# Patient Record
Sex: Female | Born: 1975 | Race: Black or African American | Hispanic: No | Smoking: Former smoker
Health system: Southern US, Community
[De-identification: ages and names within clinical notes are randomized; demographics above are authoritative.]

## PROBLEM LIST (undated history)

## (undated) DIAGNOSIS — F419 Anxiety disorder, unspecified: Secondary | ICD-10-CM

## (undated) DIAGNOSIS — E669 Obesity, unspecified: Secondary | ICD-10-CM

## (undated) DIAGNOSIS — E559 Vitamin D deficiency, unspecified: Secondary | ICD-10-CM

## (undated) DIAGNOSIS — G5600 Carpal tunnel syndrome, unspecified upper limb: Secondary | ICD-10-CM

## (undated) DIAGNOSIS — G4730 Sleep apnea, unspecified: Secondary | ICD-10-CM

## (undated) DIAGNOSIS — D649 Anemia, unspecified: Secondary | ICD-10-CM

## (undated) DIAGNOSIS — F32A Depression, unspecified: Secondary | ICD-10-CM

## (undated) DIAGNOSIS — M549 Dorsalgia, unspecified: Secondary | ICD-10-CM

## (undated) DIAGNOSIS — R7303 Prediabetes: Secondary | ICD-10-CM

## (undated) DIAGNOSIS — N840 Polyp of corpus uteri: Secondary | ICD-10-CM

## (undated) DIAGNOSIS — Z789 Other specified health status: Secondary | ICD-10-CM

## (undated) DIAGNOSIS — R600 Localized edema: Secondary | ICD-10-CM

## (undated) DIAGNOSIS — M1990 Unspecified osteoarthritis, unspecified site: Secondary | ICD-10-CM

## (undated) DIAGNOSIS — B009 Herpesviral infection, unspecified: Secondary | ICD-10-CM

## (undated) HISTORY — DX: Prediabetes: R73.03

## (undated) HISTORY — DX: Localized edema: R60.0

## (undated) HISTORY — DX: Vitamin D deficiency, unspecified: E55.9

## (undated) HISTORY — DX: Anemia, unspecified: D64.9

## (undated) HISTORY — DX: Dorsalgia, unspecified: M54.9

## (undated) HISTORY — PX: SHOULDER SURGERY: SHX246

## (undated) HISTORY — DX: Sleep apnea, unspecified: G47.30

## (undated) HISTORY — DX: Anxiety disorder, unspecified: F41.9

## (undated) HISTORY — DX: Carpal tunnel syndrome, unspecified upper limb: G56.00

## (undated) HISTORY — DX: Unspecified osteoarthritis, unspecified site: M19.90

## (undated) HISTORY — DX: Obesity, unspecified: E66.9

## (undated) HISTORY — PX: HYSTEROSCOPY: SHX211

## (undated) HISTORY — DX: Depression, unspecified: F32.A

## (undated) HISTORY — PX: DILATION AND CURETTAGE OF UTERUS: SHX78

---

## 2000-07-12 ENCOUNTER — Emergency Department (HOSPITAL_COMMUNITY): Admission: EM | Admit: 2000-07-12 | Discharge: 2000-07-13 | Payer: Self-pay | Admitting: Emergency Medicine

## 2000-07-19 ENCOUNTER — Encounter: Admission: RE | Admit: 2000-07-19 | Discharge: 2000-07-19 | Payer: Self-pay | Admitting: Hematology and Oncology

## 2003-02-27 ENCOUNTER — Emergency Department (HOSPITAL_COMMUNITY): Admission: EM | Admit: 2003-02-27 | Discharge: 2003-02-27 | Payer: Self-pay | Admitting: Emergency Medicine

## 2003-02-27 ENCOUNTER — Encounter: Payer: Self-pay | Admitting: Emergency Medicine

## 2004-07-18 ENCOUNTER — Ambulatory Visit: Payer: Self-pay | Admitting: Family Medicine

## 2004-07-22 ENCOUNTER — Ambulatory Visit: Payer: Self-pay | Admitting: Internal Medicine

## 2004-08-26 ENCOUNTER — Ambulatory Visit: Payer: Self-pay | Admitting: Internal Medicine

## 2004-10-02 ENCOUNTER — Emergency Department (HOSPITAL_COMMUNITY): Admission: EM | Admit: 2004-10-02 | Discharge: 2004-10-02 | Payer: Self-pay | Admitting: Emergency Medicine

## 2004-10-02 IMAGING — CT CT HEAD W/O CM
1 series · 16 of 28 positions shown, 20 images · non-contrast
Comparison: None.

CLINICAL DATA: Headache.
TECHNIQUE: Contiguous axial CT images were taken from the skull base to the vertex without contrast administration.

[Series 2: head_seq 5.0 h30s · axial · 0.43mm/px · z∈[+1171,+1296]mm · 16 of 28 slices shown, 20 images]
[im 2/28  brain]
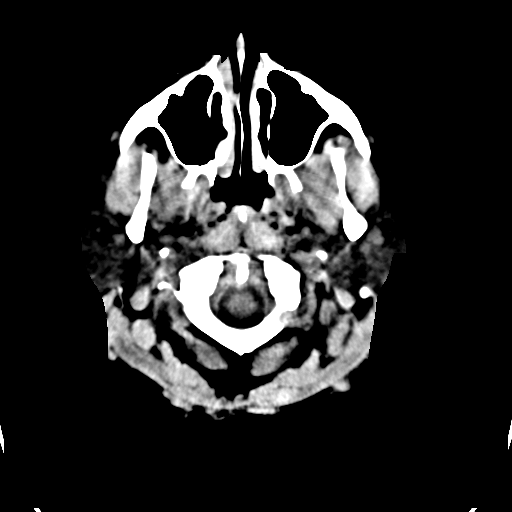
[im 2/28  bone]
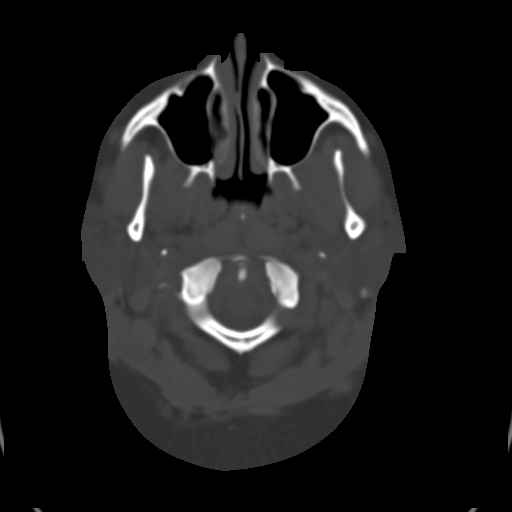
[im 4/28  brain]
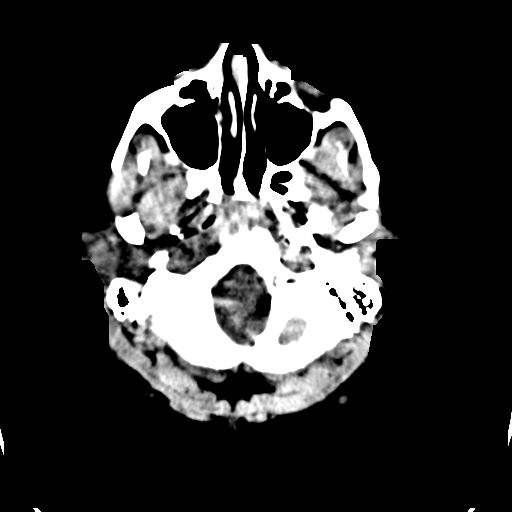
[im 6/28  brain]
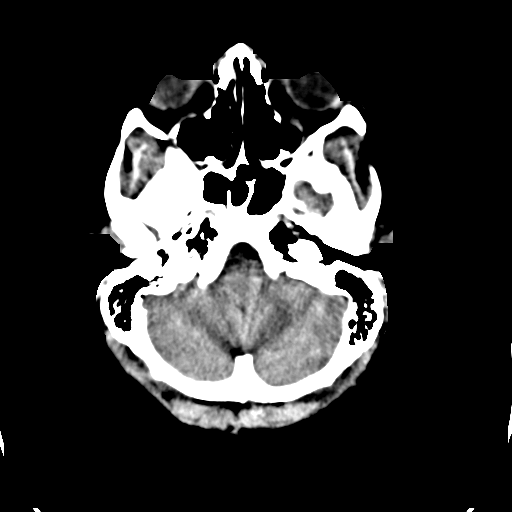
[im 7/28  brain]
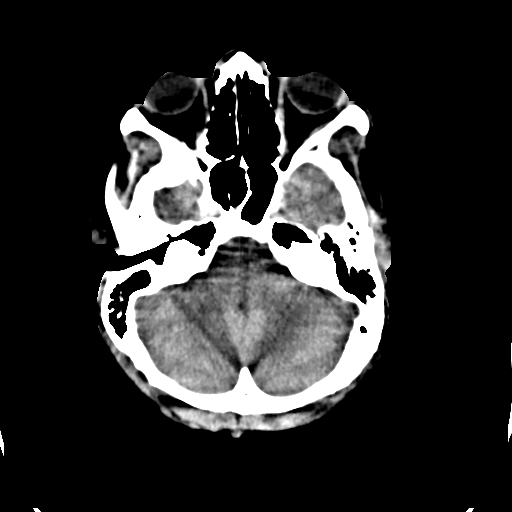
[im 9/28  brain]
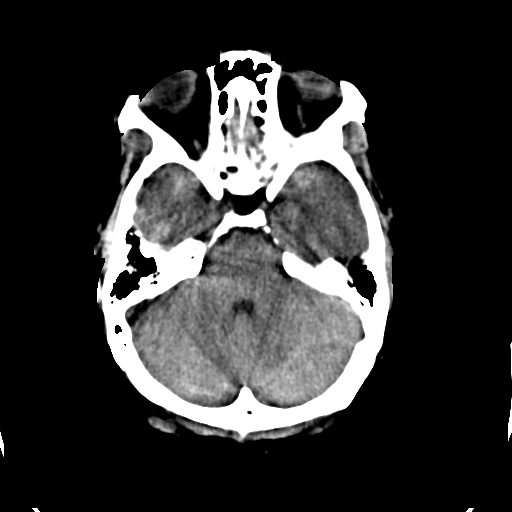
[im 9/28  bone]
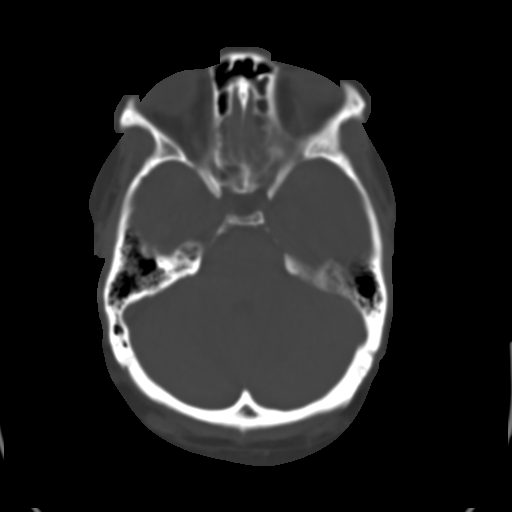
[im 10/28  brain]
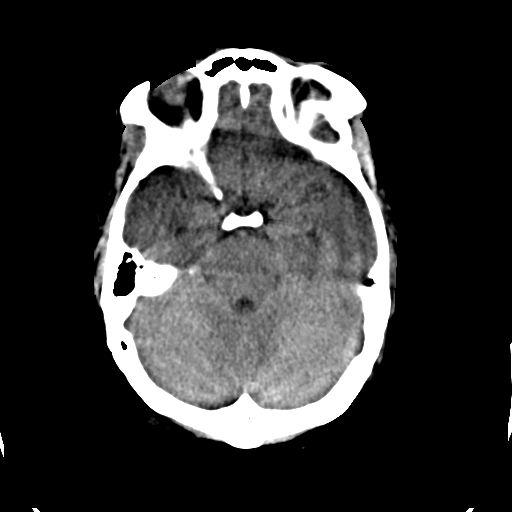
[im 12/28  brain]
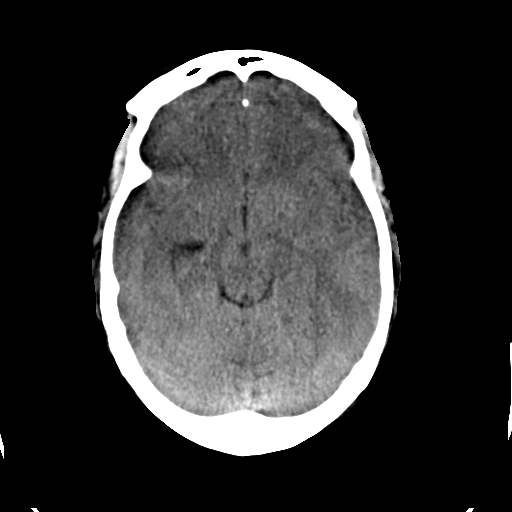
[im 14/28  brain]
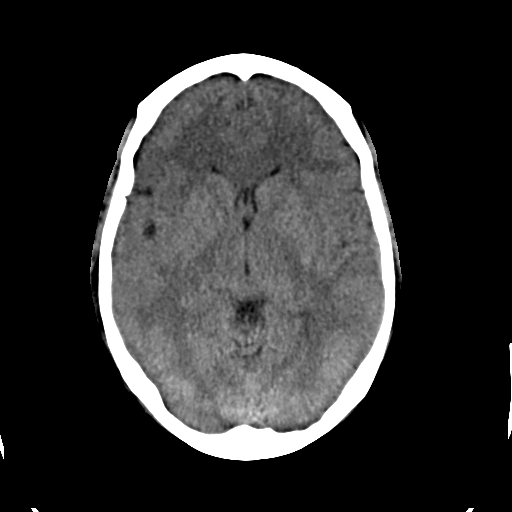
[im 15/28  brain]
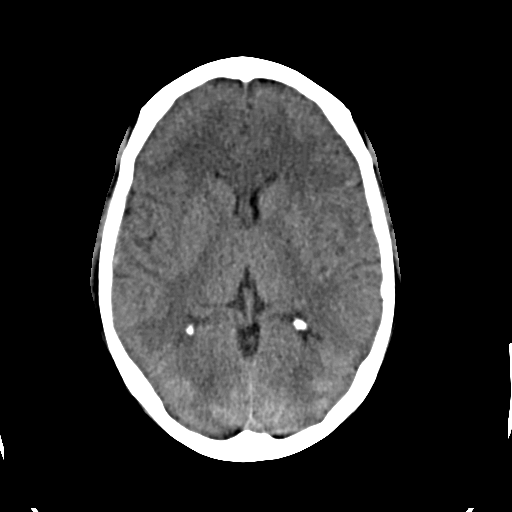
[im 15/28  bone]
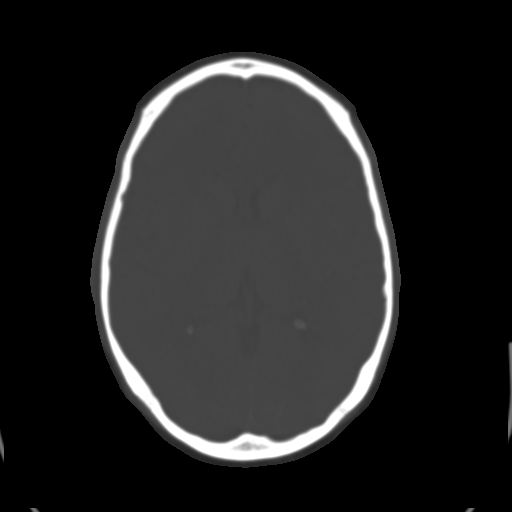
[im 17/28  brain]
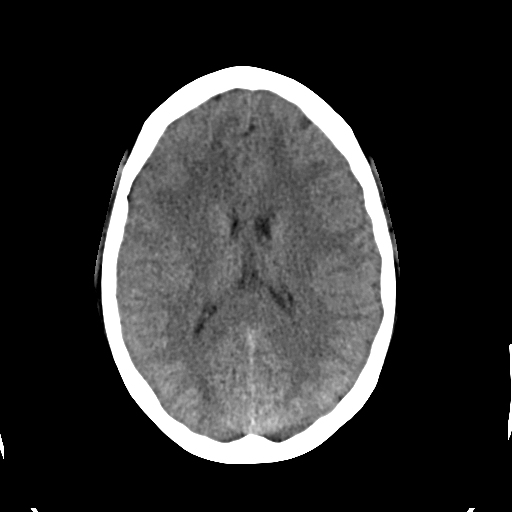
[im 19/28  brain]
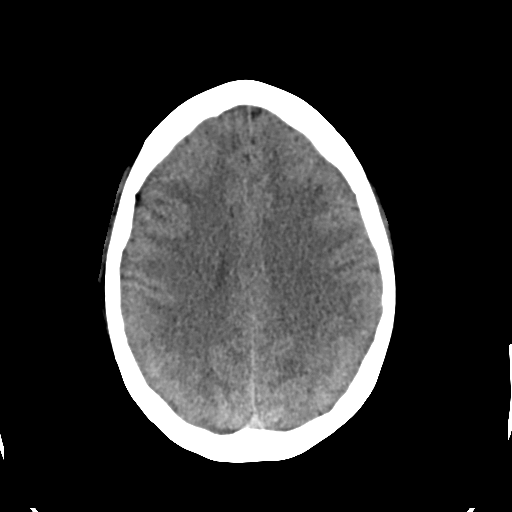
[im 20/28  brain]
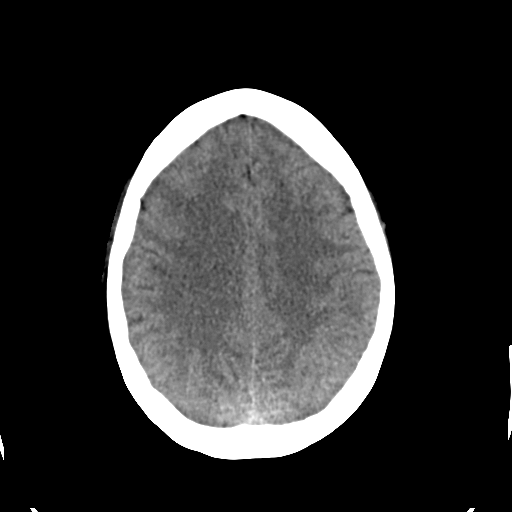
[im 22/28  brain]
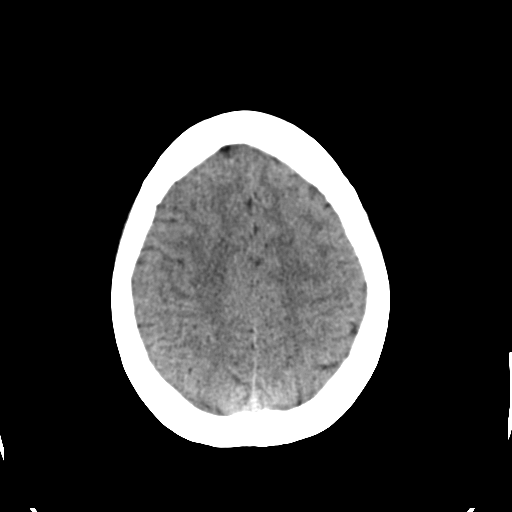
[im 22/28  bone]
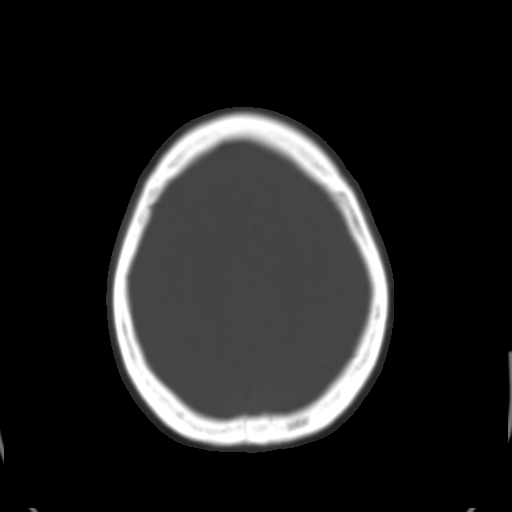
[im 23/28  brain]
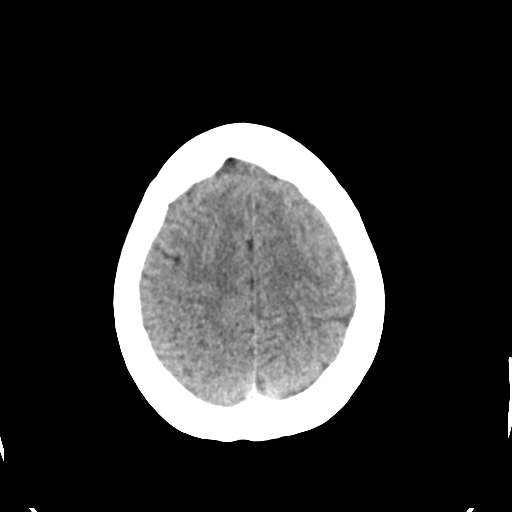
[im 25/28  brain]
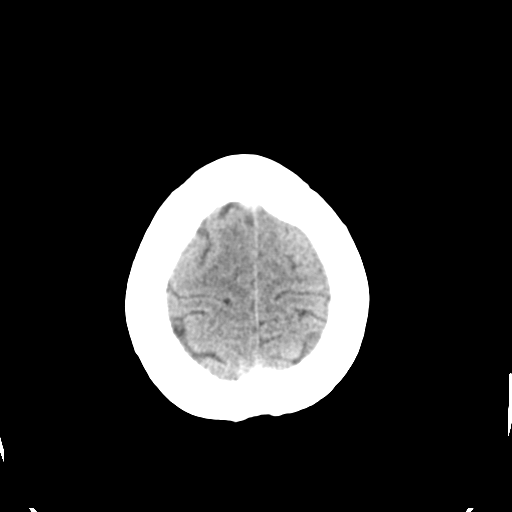
[im 27/28  brain]
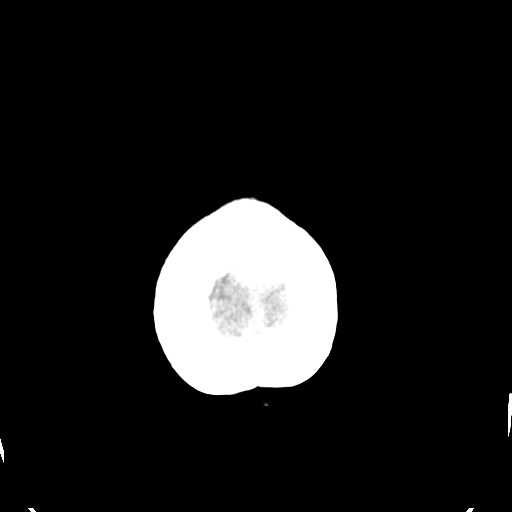

[16 of 28 positions shown; findings below may reference images not displayed]

FINDINGS: The brain appears normal without evidence of hemorrhage, infarct, mass, mass effect, midline shift or abnormal extraaxial fluid collection.  Ventricles are of appropriate size and configuration.  A tiny focus of mucosal thickening is identified in the right sphenoid sinus.
IMPRESSION: Negative exam.

## 2004-12-15 ENCOUNTER — Ambulatory Visit: Payer: Self-pay | Admitting: Family Medicine

## 2004-12-15 ENCOUNTER — Ambulatory Visit: Payer: Self-pay | Admitting: *Deleted

## 2007-06-08 LAB — CONVERTED CEMR LAB: Pap Smear: NORMAL

## 2007-08-18 ENCOUNTER — Encounter (INDEPENDENT_AMBULATORY_CARE_PROVIDER_SITE_OTHER): Payer: Self-pay | Admitting: Obstetrics and Gynecology

## 2007-08-18 ENCOUNTER — Ambulatory Visit (HOSPITAL_COMMUNITY): Admission: RE | Admit: 2007-08-18 | Discharge: 2007-08-18 | Payer: Self-pay | Admitting: Obstetrics and Gynecology

## 2007-09-02 ENCOUNTER — Ambulatory Visit: Payer: Self-pay | Admitting: Internal Medicine

## 2007-09-02 DIAGNOSIS — D649 Anemia, unspecified: Secondary | ICD-10-CM | POA: Insufficient documentation

## 2007-09-02 DIAGNOSIS — N920 Excessive and frequent menstruation with regular cycle: Secondary | ICD-10-CM | POA: Insufficient documentation

## 2007-09-02 DIAGNOSIS — E669 Obesity, unspecified: Secondary | ICD-10-CM | POA: Insufficient documentation

## 2007-09-02 DIAGNOSIS — B351 Tinea unguium: Secondary | ICD-10-CM | POA: Insufficient documentation

## 2007-09-02 LAB — CONVERTED CEMR LAB
Bilirubin Urine: NEGATIVE
Blood in Urine, dipstick: NEGATIVE
Glucose, Urine, Semiquant: NEGATIVE
Ketones, urine, test strip: NEGATIVE
Nitrite: NEGATIVE
Protein, U semiquant: NEGATIVE
Specific Gravity, Urine: 1.025
Urobilinogen, UA: 0.2
WBC Urine, dipstick: NEGATIVE
pH: 7

## 2007-09-06 LAB — CONVERTED CEMR LAB
ALT: 13 units/L (ref 0–35)
AST: 14 units/L (ref 0–37)
Albumin: 3.7 g/dL (ref 3.5–5.2)
Alkaline Phosphatase: 48 units/L (ref 39–117)
BUN: 10 mg/dL (ref 6–23)
Basophils Absolute: 0 10*3/uL (ref 0.0–0.1)
Basophils Relative: 0 % (ref 0–1)
Bilirubin, Direct: <0.1 mg/dL (ref 0.0–0.3)
CO2: 27 meq/L (ref 19–32)
Calcium: 9.4 mg/dL (ref 8.4–10.5)
Chloride: 103 meq/L (ref 96–112)
Cholesterol: 147 mg/dL (ref 0–200)
Creatinine, Ser: 0.84 mg/dL (ref 0.40–1.20)
Eosinophils Absolute: 0.1 10*3/uL (ref 0.0–0.7)
Eosinophils Relative: 1 % (ref 0–5)
Glucose, Bld: 123 mg/dL — ABNORMAL HIGH (ref 70–99)
HCT: 36.2 % (ref 36.0–46.0)
HDL: 58 mg/dL (ref >39)
Hemoglobin: 11.7 g/dL — ABNORMAL LOW (ref 12.0–15.0)
LDL Cholesterol: 69 mg/dL (ref 0–99)
Lymphocytes Relative: 39 % (ref 12–46)
Lymphs Abs: 2.8 10*3/uL (ref 0.7–4.0)
MCHC: 32.3 g/dL (ref 30.0–36.0)
MCV: 88.7 fL (ref 78.0–100.0)
Monocytes Absolute: 0.5 10*3/uL (ref 0.1–1.0)
Monocytes Relative: 7 % (ref 3–12)
Neutro Abs: 3.8 10*3/uL (ref 1.7–7.7)
Neutrophils Relative %: 52 % (ref 43–77)
Platelets: 489 10*3/uL — ABNORMAL HIGH (ref 150–400)
Potassium: 4.5 meq/L (ref 3.5–5.3)
RBC: 4.08 M/uL (ref 3.87–5.11)
RDW: 13.3 % (ref 11.5–15.5)
Sodium: 140 meq/L (ref 135–145)
TSH: 0.757 microintl units/mL (ref 0.350–5.50)
Total Bilirubin: 0.2 mg/dL — ABNORMAL LOW (ref 0.3–1.2)
Total CHOL/HDL Ratio: 2.5
Total Protein: 7 g/dL (ref 6.0–8.3)
Triglycerides: 98 mg/dL (ref <150)
VLDL: 20 mg/dL (ref 0–40)
WBC: 7.2 10*3/uL (ref 4.0–10.5)

## 2007-09-19 ENCOUNTER — Telehealth: Payer: Self-pay | Admitting: Family Medicine

## 2008-06-18 ENCOUNTER — Ambulatory Visit: Payer: Self-pay | Admitting: Internal Medicine

## 2008-06-18 DIAGNOSIS — J069 Acute upper respiratory infection, unspecified: Secondary | ICD-10-CM | POA: Insufficient documentation

## 2008-06-18 IMAGING — CR DG CHEST 2V
2 series · 2 of 2 positions shown · non-contrast
Comparison: None.

CLINICAL DATA: Chest pain, shortness of breath and cough.

CHEST - 2 VIEW

[view not recorded (1 of 2)]
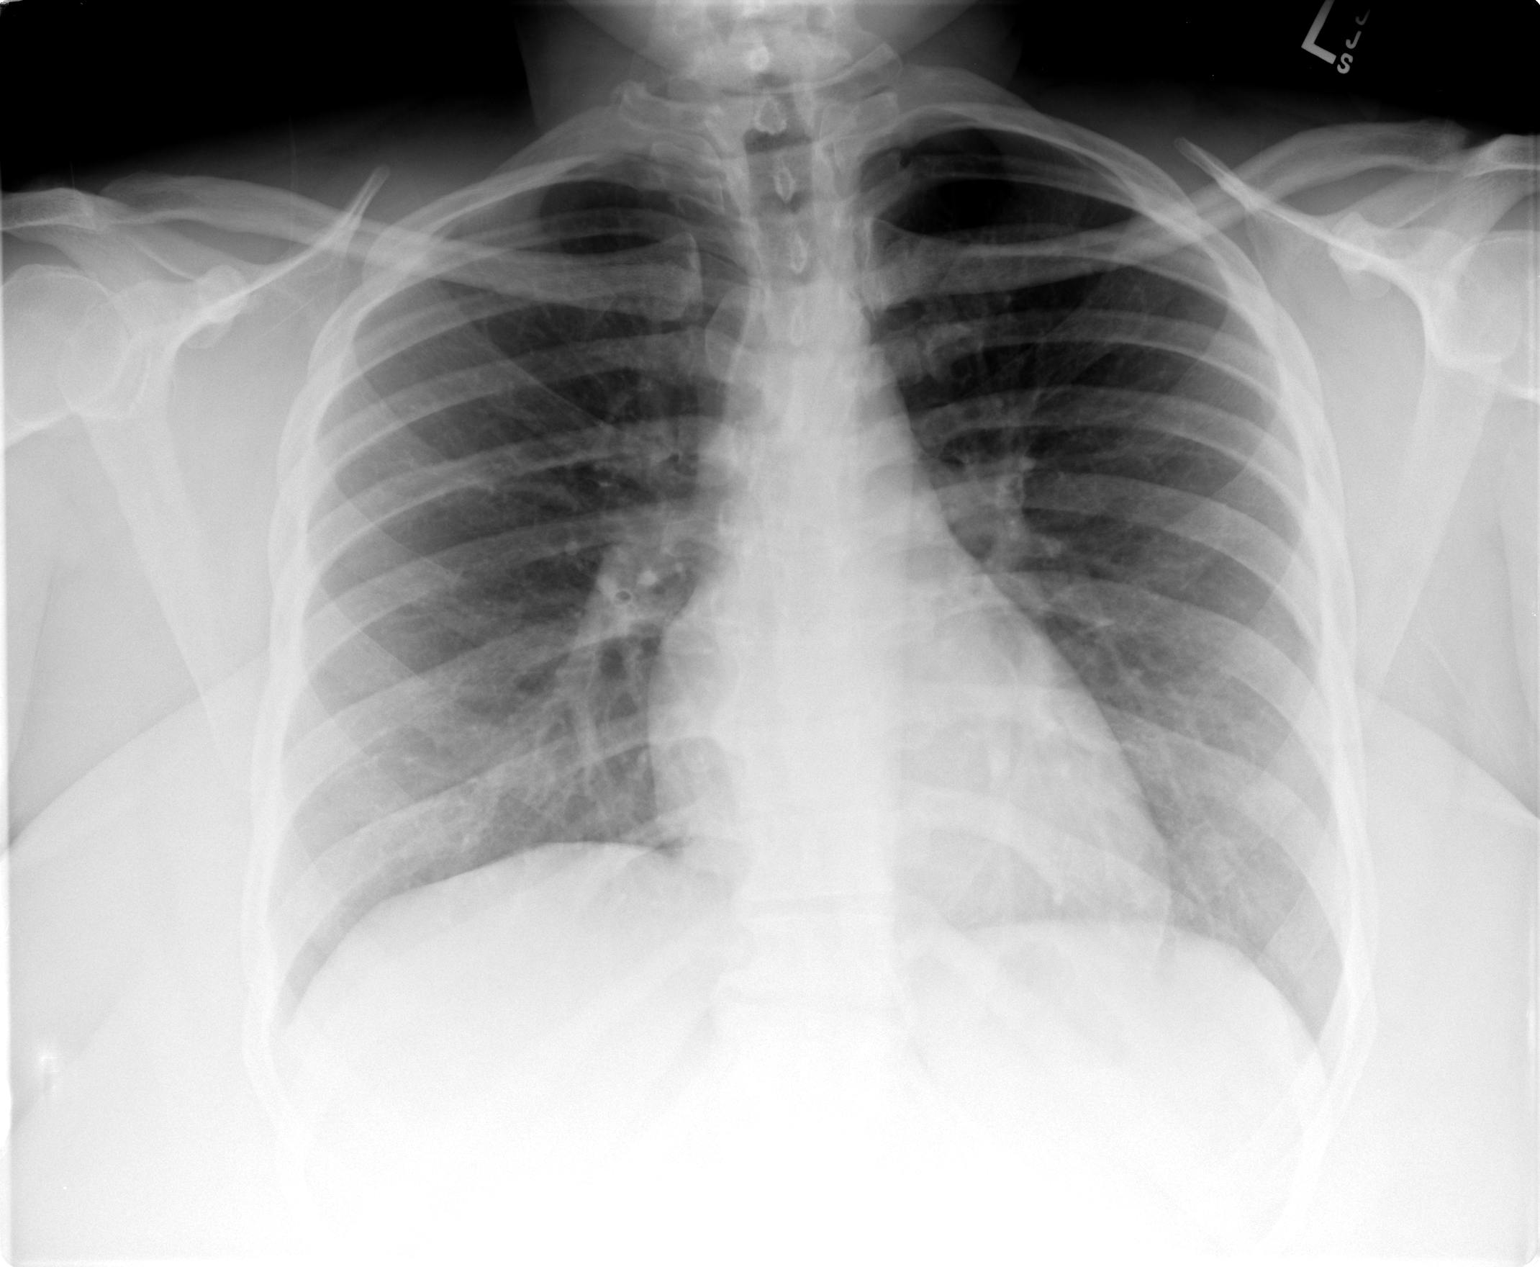

[view not recorded (2 of 2)]
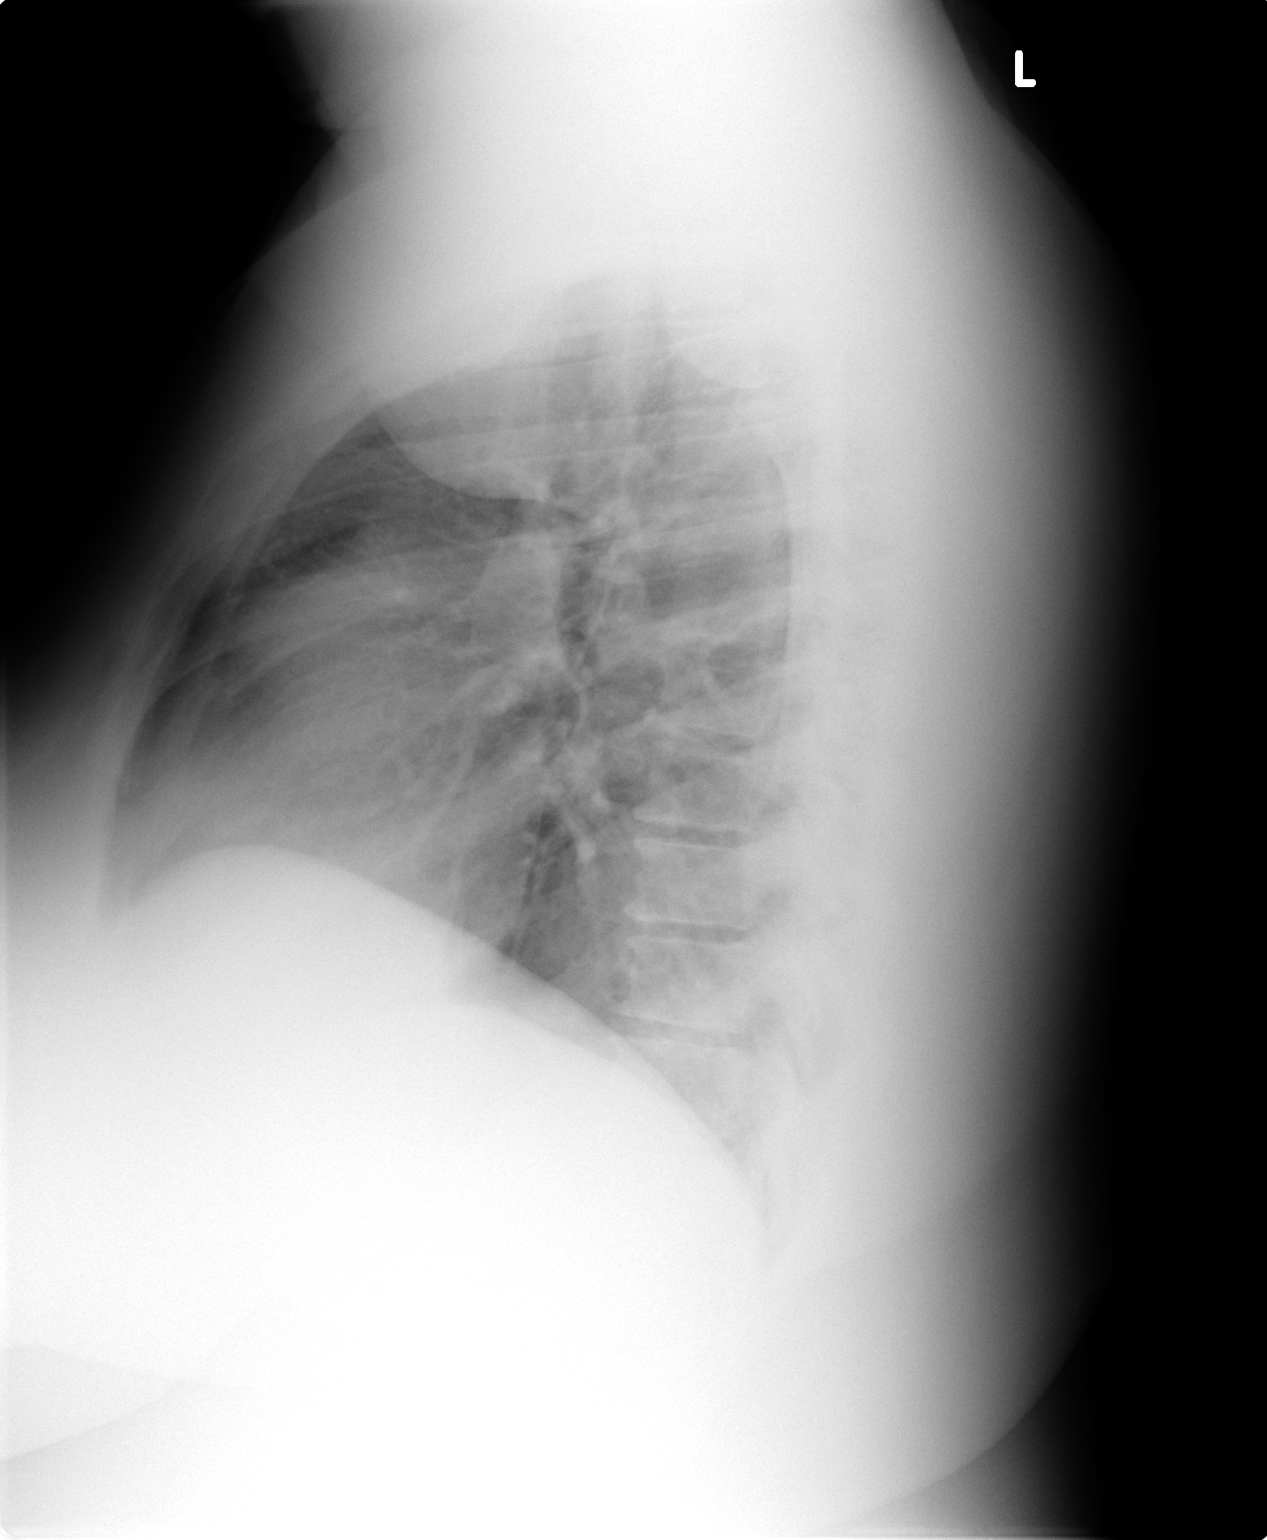

[2 of 2 positions shown; findings below may reference images not displayed]

FINDINGS: The cardiac silhouette, mediastinal and hilar contours
are within normal limits and stable. The lungs are clear.  No
pleural effusions. Minimal peribronchial thickening.

The bony thorax is intact.
IMPRESSION: 1. No acute cardiopulmonary findings.

## 2008-06-19 ENCOUNTER — Encounter: Payer: Self-pay | Admitting: Internal Medicine

## 2008-06-19 ENCOUNTER — Telehealth: Payer: Self-pay | Admitting: Internal Medicine

## 2008-07-20 ENCOUNTER — Telehealth: Payer: Self-pay | Admitting: Internal Medicine

## 2008-07-30 ENCOUNTER — Ambulatory Visit: Payer: Self-pay | Admitting: Internal Medicine

## 2008-07-30 DIAGNOSIS — M542 Cervicalgia: Secondary | ICD-10-CM | POA: Insufficient documentation

## 2008-07-30 DIAGNOSIS — R209 Unspecified disturbances of skin sensation: Secondary | ICD-10-CM | POA: Insufficient documentation
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
## 2008-11-12 ENCOUNTER — Encounter: Payer: Self-pay | Admitting: Internal Medicine

## 2008-11-13 ENCOUNTER — Telehealth: Payer: Self-pay | Admitting: Internal Medicine

## 2009-01-31 ENCOUNTER — Telehealth (INDEPENDENT_AMBULATORY_CARE_PROVIDER_SITE_OTHER): Payer: Self-pay | Admitting: *Deleted

## 2009-03-06 ENCOUNTER — Ambulatory Visit (HOSPITAL_COMMUNITY): Admission: RE | Admit: 2009-03-06 | Discharge: 2009-03-06 | Payer: Self-pay | Admitting: Obstetrics and Gynecology

## 2009-03-06 IMAGING — US US OB DETAIL+14 WK
1 series · 14 of 28 positions shown · non-contrast
Comparison: none

OBSTETRICAL ULTRASOUND:
 This ultrasound was performed in The [HOSPITAL], and the [facility] OB/GY[facility] report will be stored to [REDACTED] PACS.

[Series 1: us ob detail+14 wk · 14 of 57 slices shown]
[im 3/57]
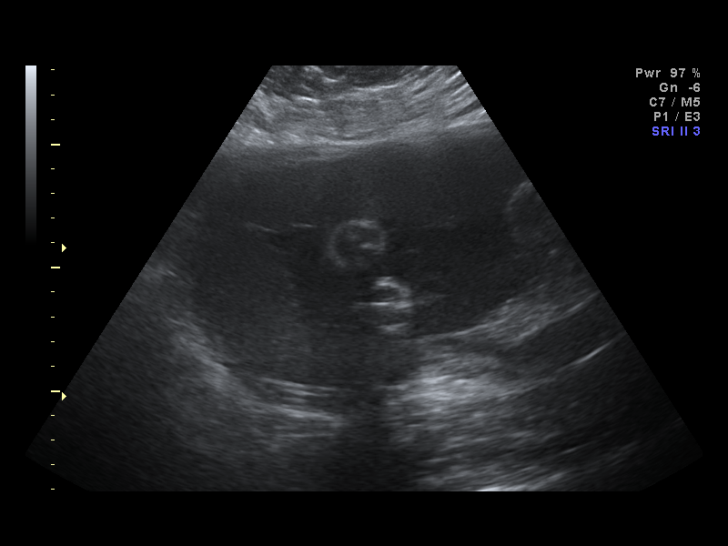
[im 7/57]
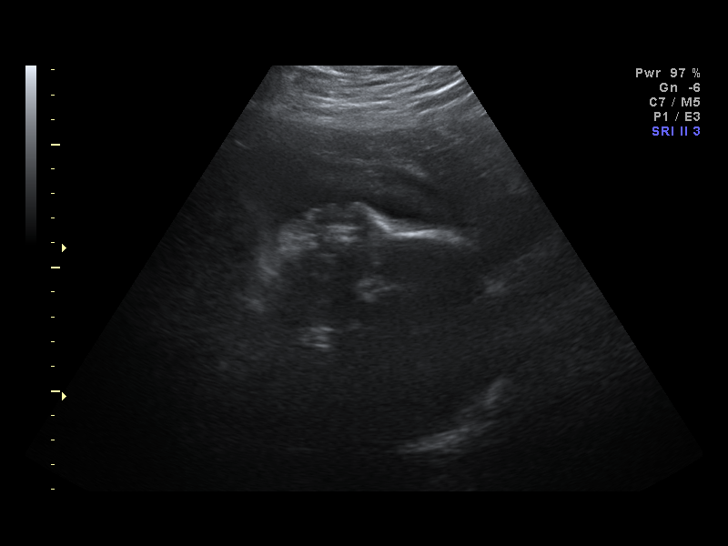
[im 11/57]
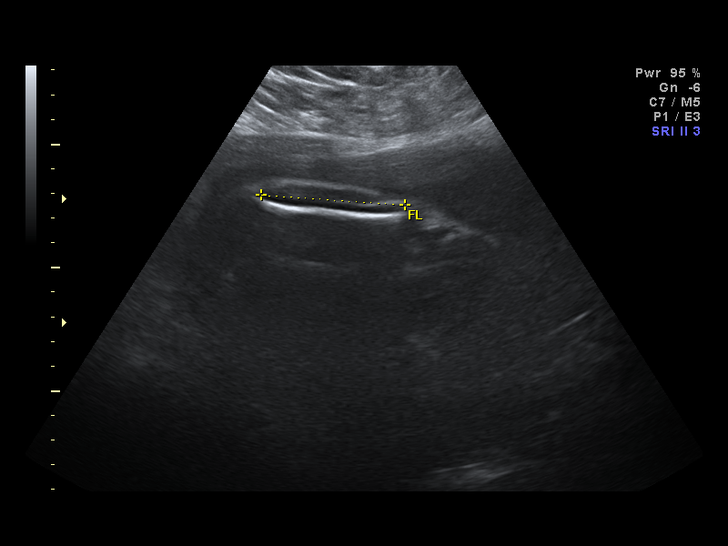
[im 15/57]
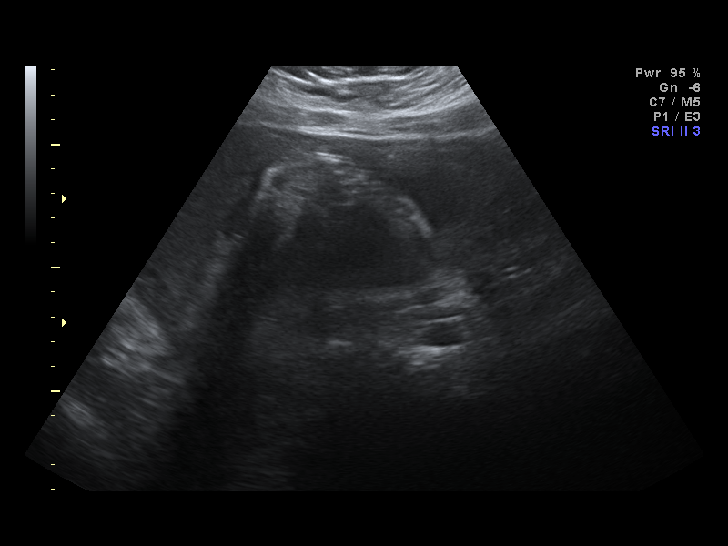
[im 19/57]
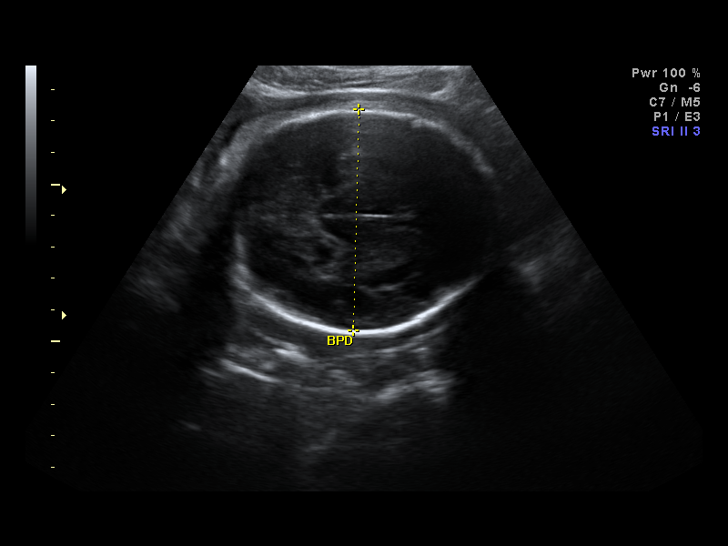
[im 23/57]
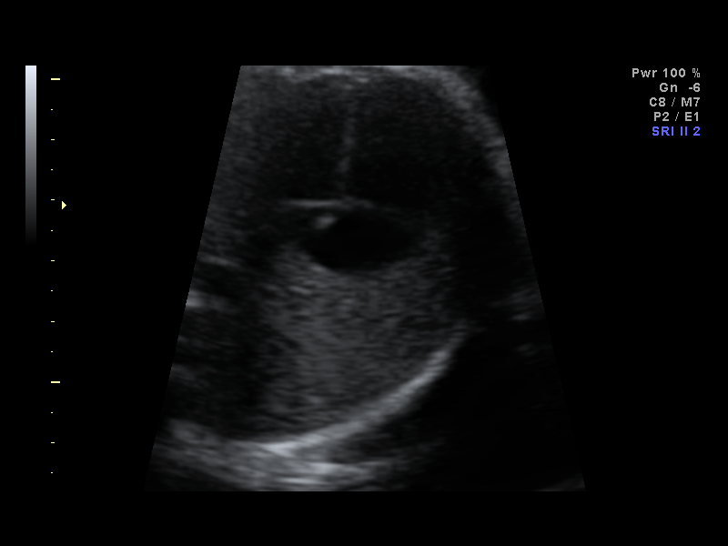
[im 27/57]
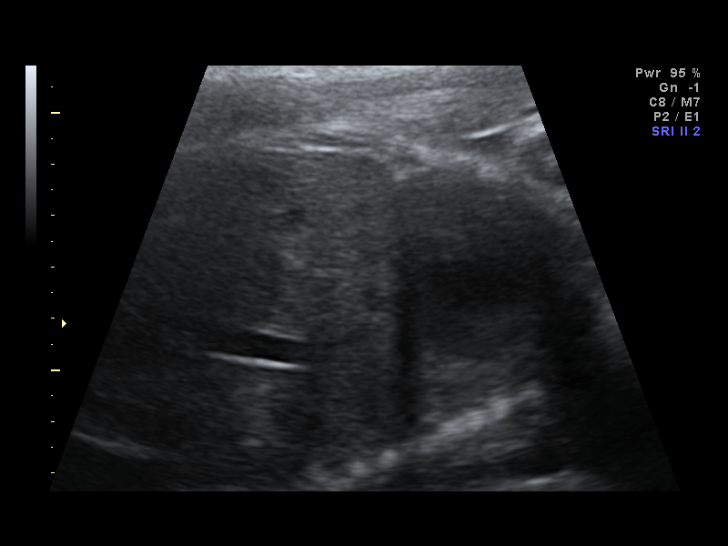
[im 32/57]
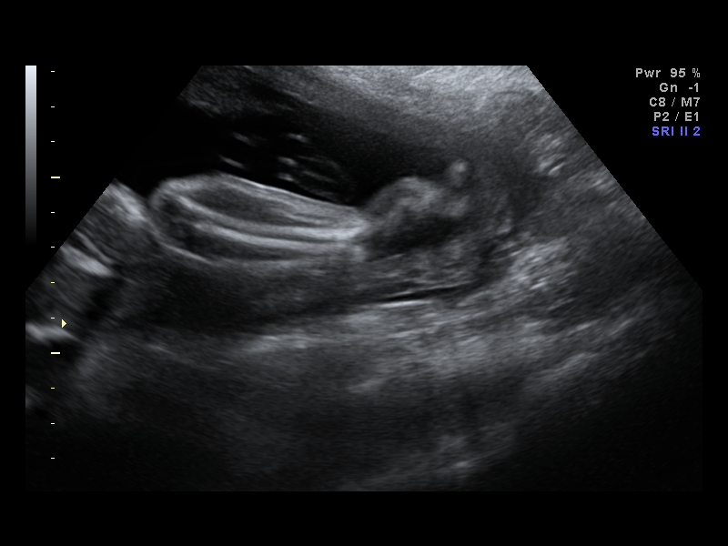
[im 36/57]
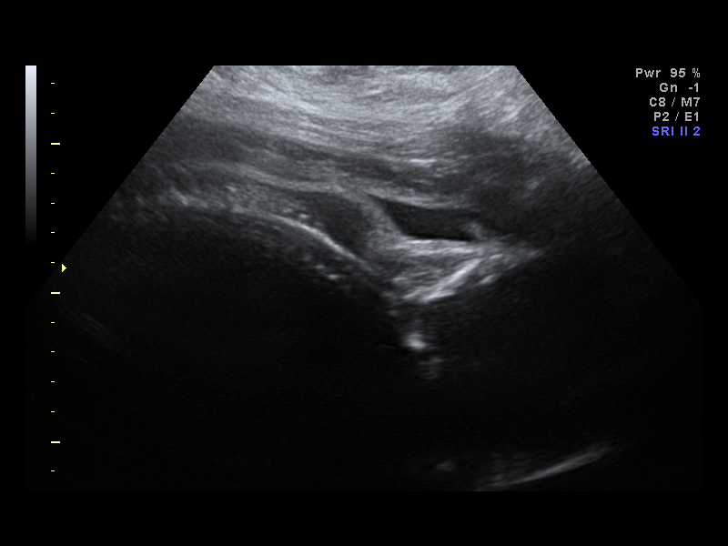
[im 40/57]
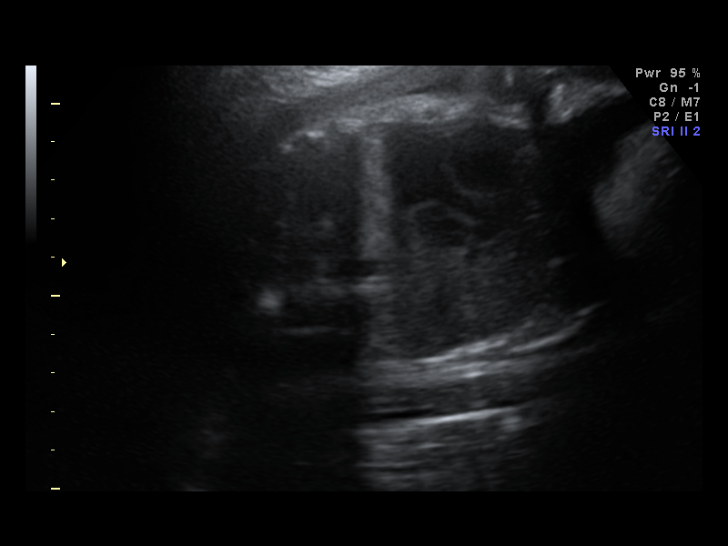
[im 44/57]
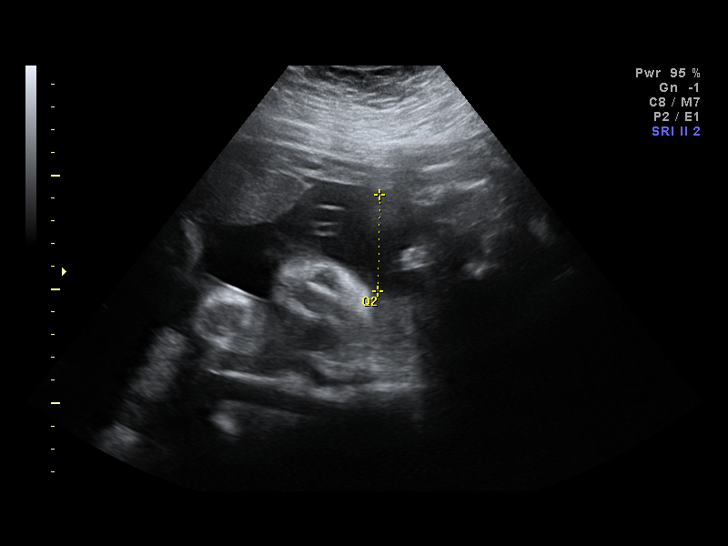
[im 48/57]
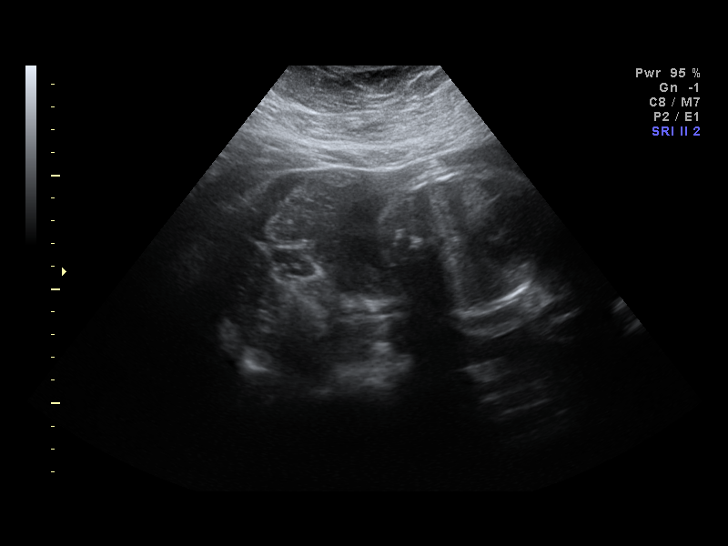
[im 52/57]
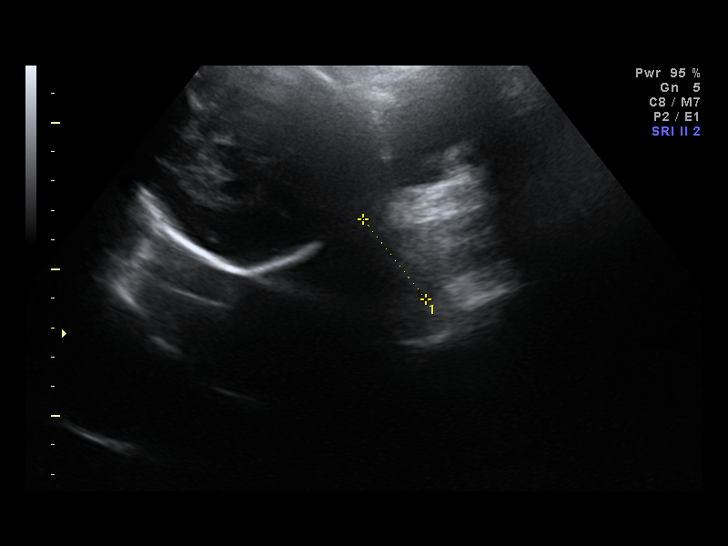
[im 57/57]
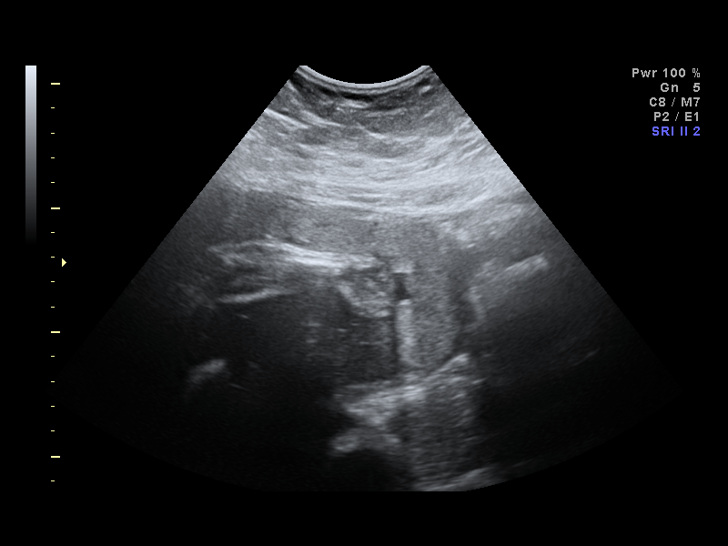

[14 of 28 positions shown; findings below may reference images not displayed]

IMPRESSION: AS OB/GYN has also been faxed to the ordering physician.

## 2009-03-28 ENCOUNTER — Ambulatory Visit (HOSPITAL_COMMUNITY): Admission: RE | Admit: 2009-03-28 | Discharge: 2009-03-28 | Payer: Self-pay | Admitting: Student

## 2009-03-28 IMAGING — US US OB FOLLOW-UP
1 series · 18 of 28 positions shown · non-contrast
Comparison: none

OBSTETRICAL ULTRASOUND:
 This ultrasound was performed in The [HOSPITAL], and the [facility] OB/GY[facility] report will be stored to [REDACTED] PACS.

[Series 1: us ob follow-up · 18 of 48 slices shown]
[im 1/48]
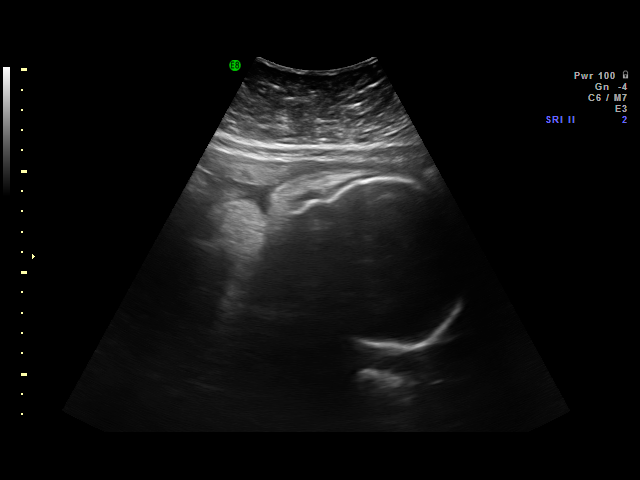
[im 4/48]
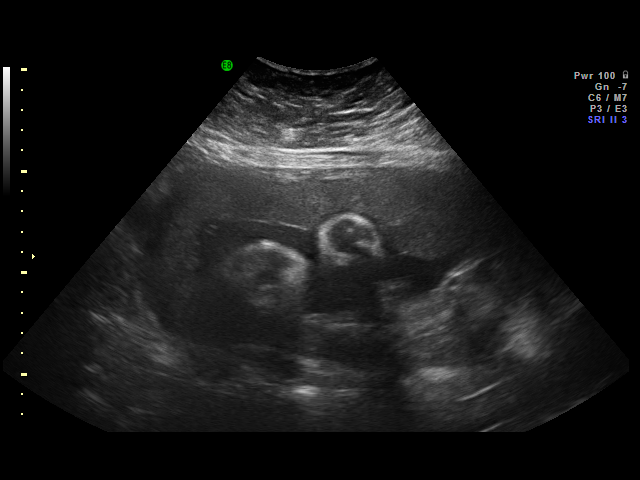
[im 6/48]
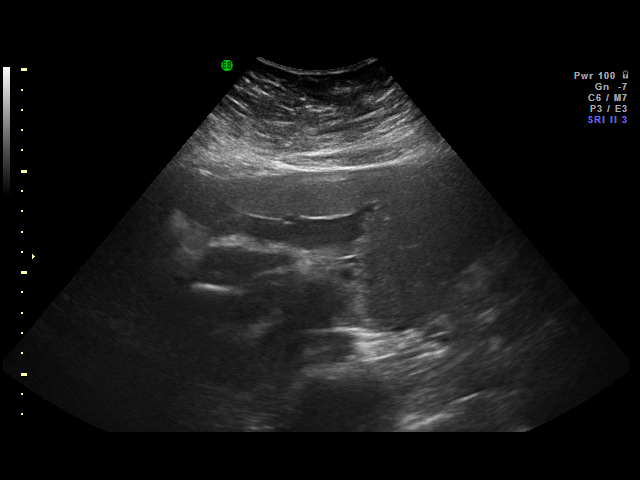
[im 9/48]
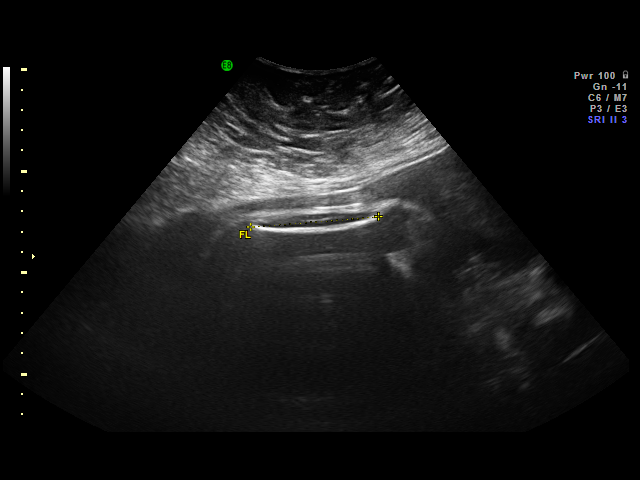
[im 13/48]
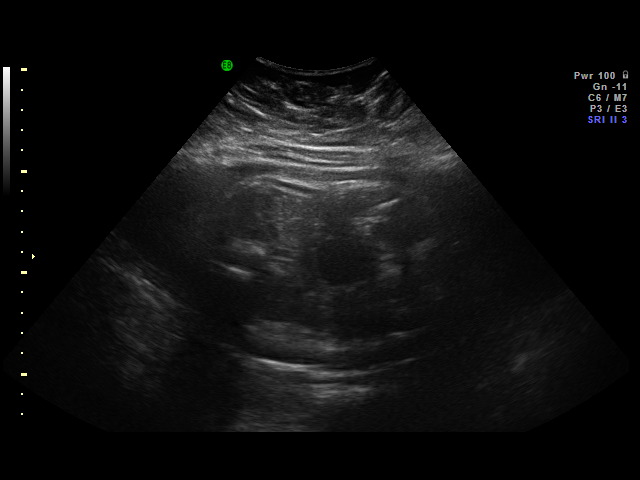
[im 14/48]
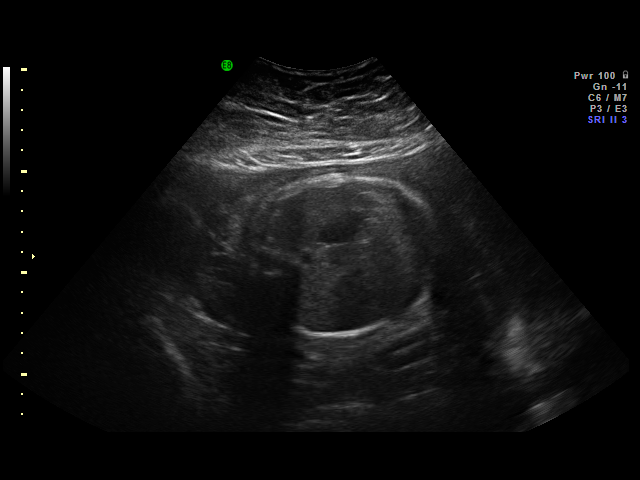
[im 18/48]
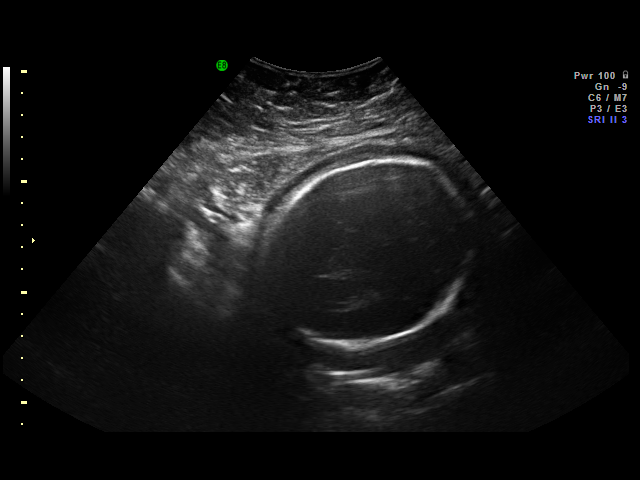
[im 20/48]
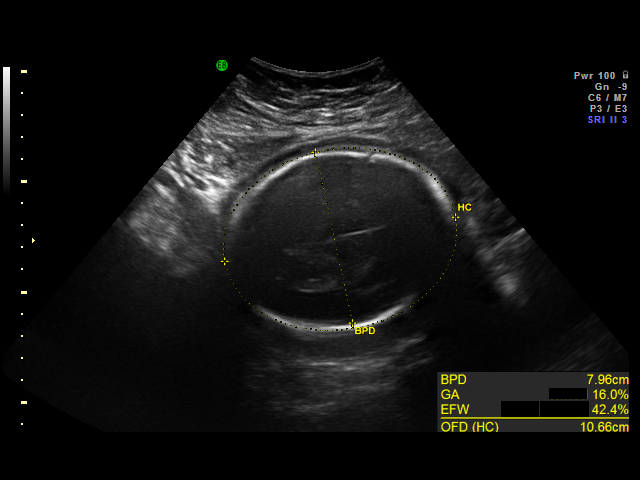
[im 23/48]
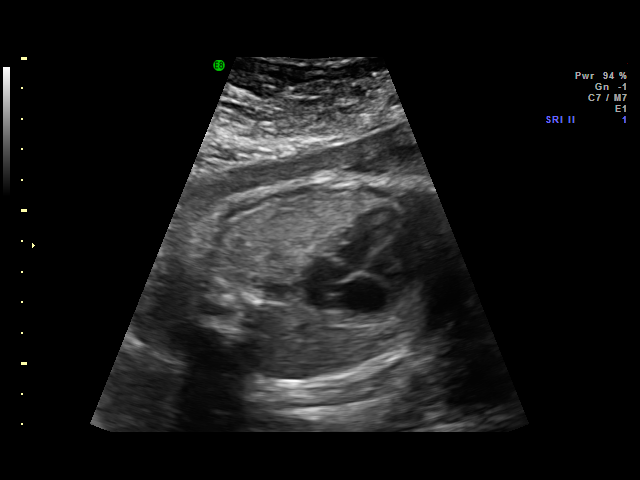
[im 25/48]
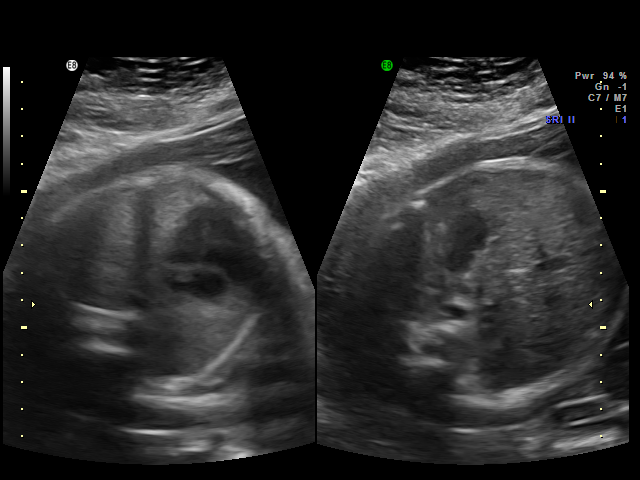
[im 28/48]
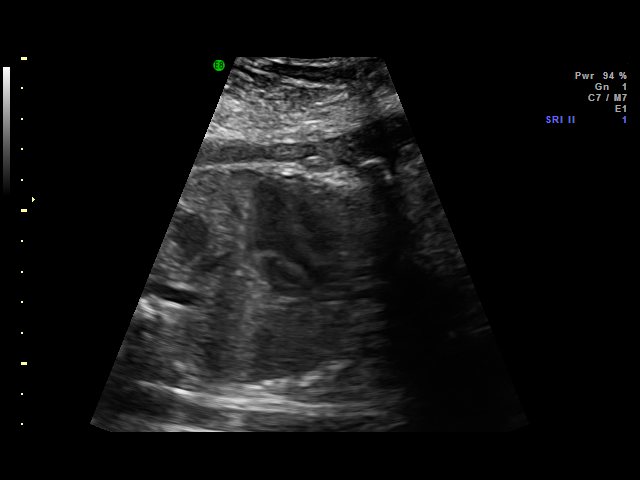
[im 30/48]
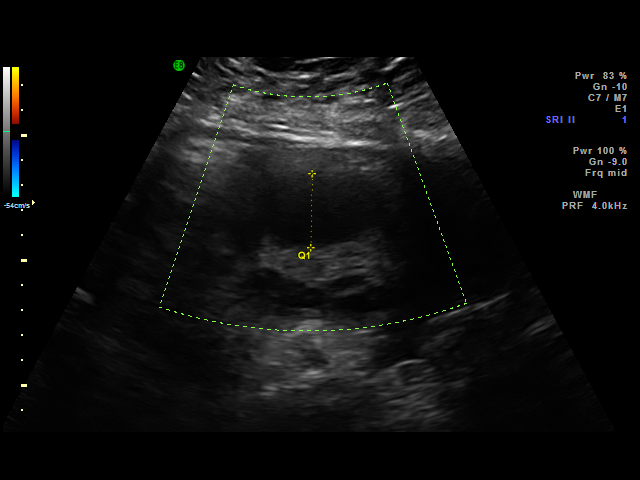
[im 34/48]
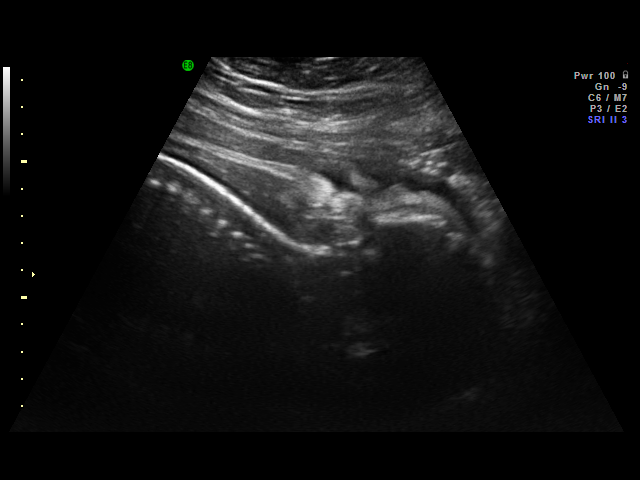
[im 37/48]
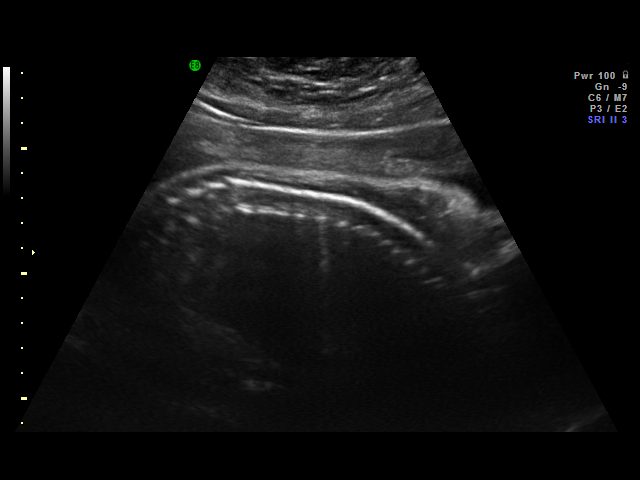
[im 39/48]
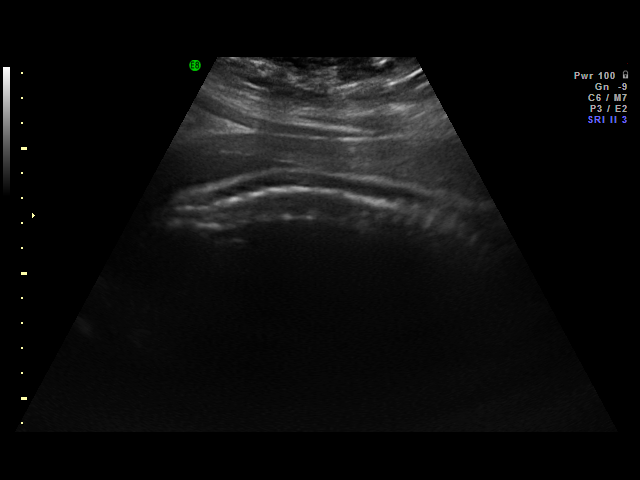
[im 42/48]
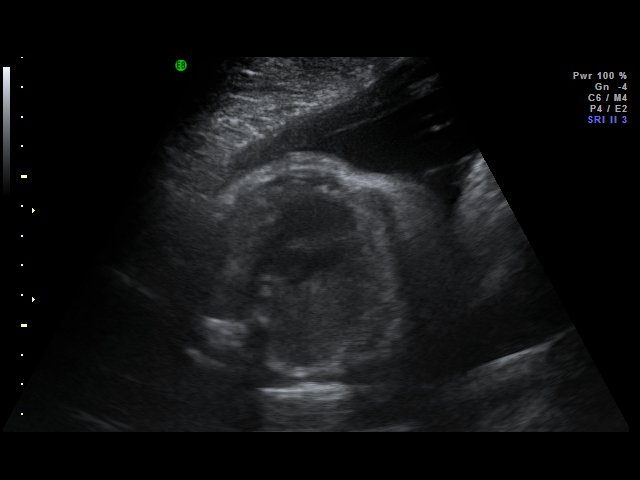
[im 44/48]
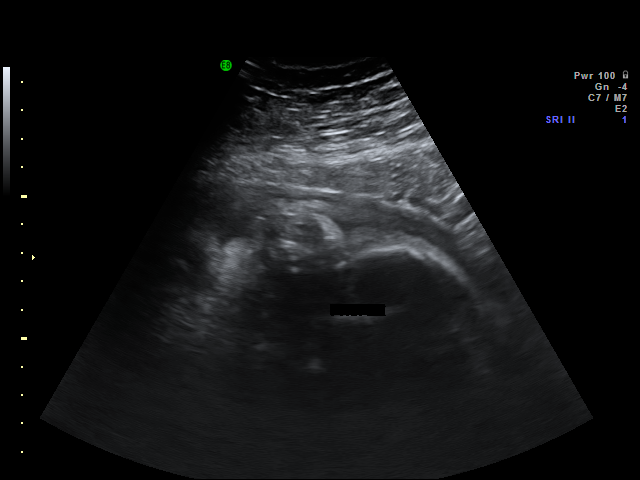
[im 48/48]
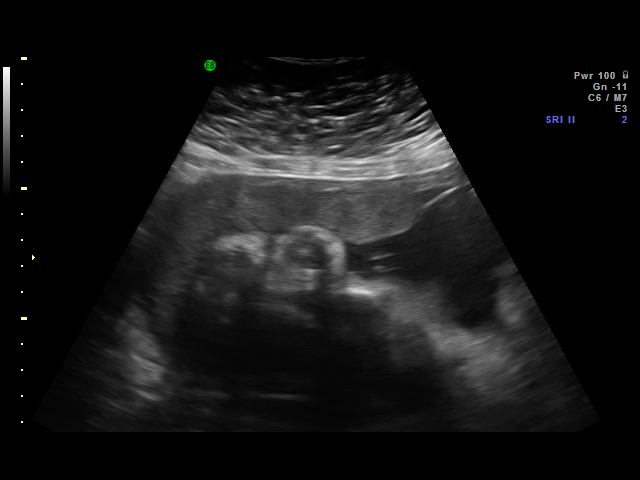

[18 of 28 positions shown; findings below may reference images not displayed]

IMPRESSION: AS OB/GYN has also been faxed to the ordering physician.

## 2009-04-18 ENCOUNTER — Ambulatory Visit (HOSPITAL_COMMUNITY): Admission: RE | Admit: 2009-04-18 | Discharge: 2009-04-18 | Payer: Self-pay | Admitting: Obstetrics and Gynecology

## 2009-04-18 IMAGING — US US OB FOLLOW-UP
1 series · 18 of 27 positions shown · non-contrast
Comparison: none

OBSTETRICAL ULTRASOUND:
 This ultrasound was performed in The [HOSPITAL], and the [facility] OB/GY[facility] report will be stored to [REDACTED] PACS.

[Series 1: us ob follow-up · 27 acquisitions, 18 frames shown]
[im 1/27]
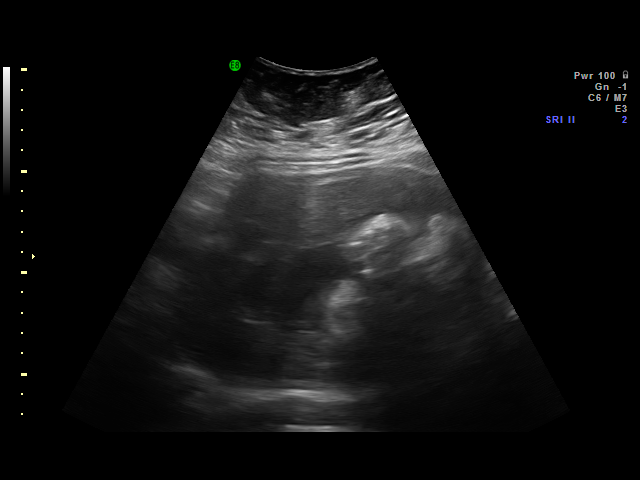
[im 3/27]
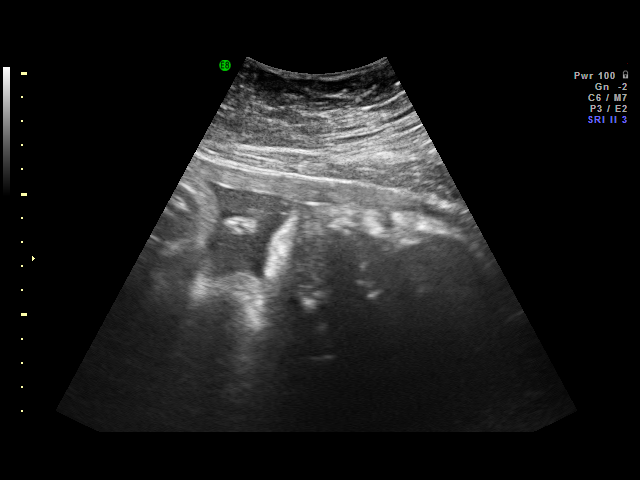
[im 4/27]
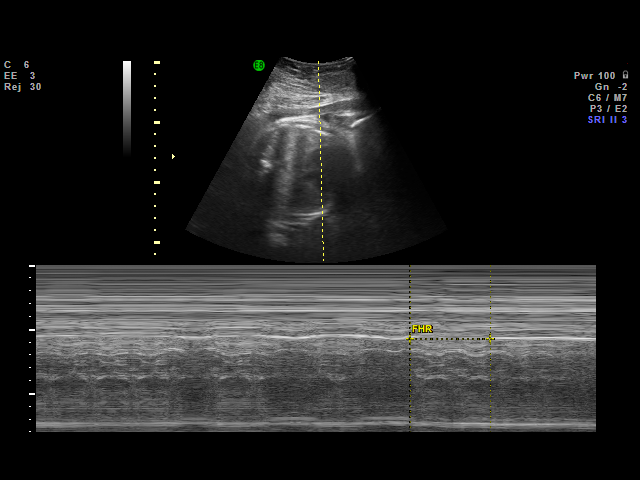
[im 6/27]
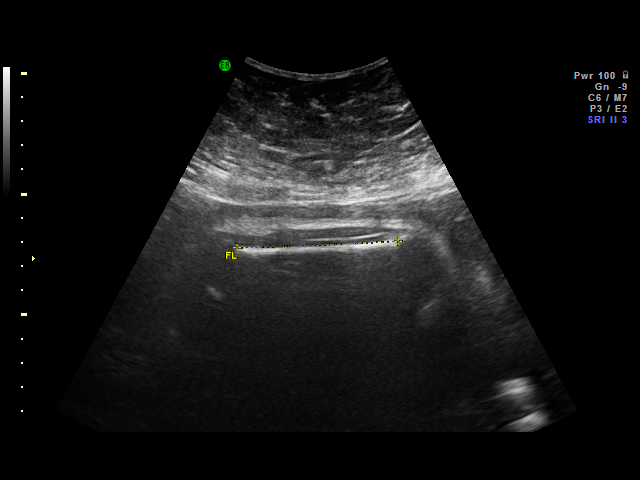
[im 7/27]
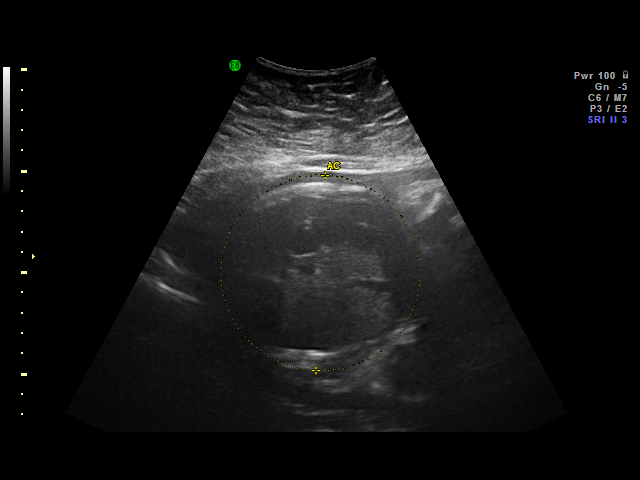
[im 9/27]
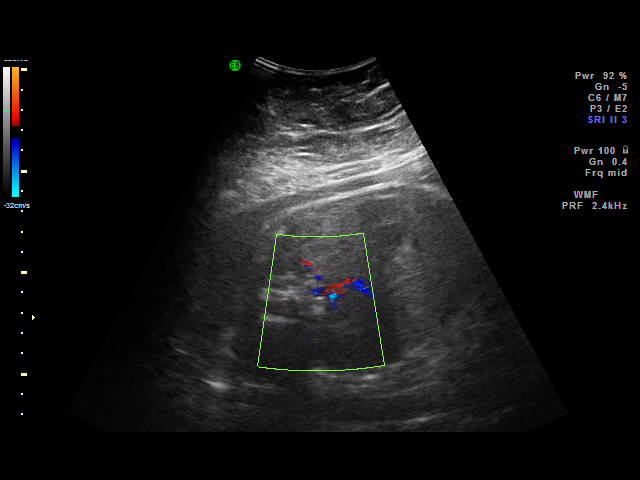
[im 10/27]
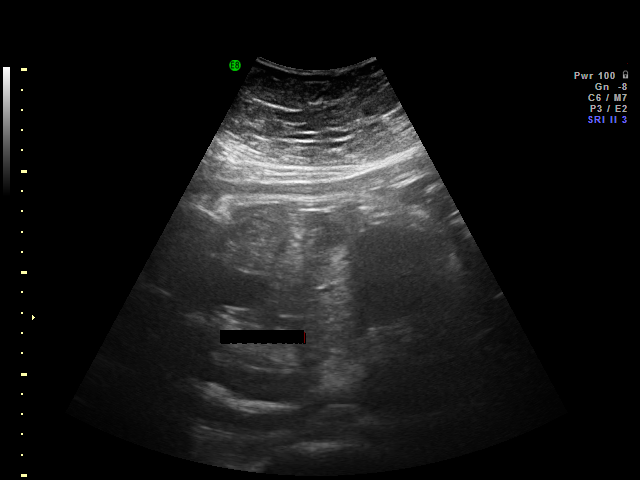
[im 12/27]
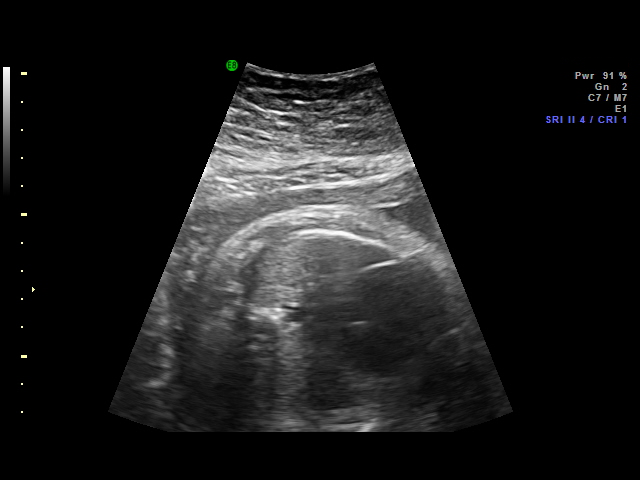
[im 13/27]
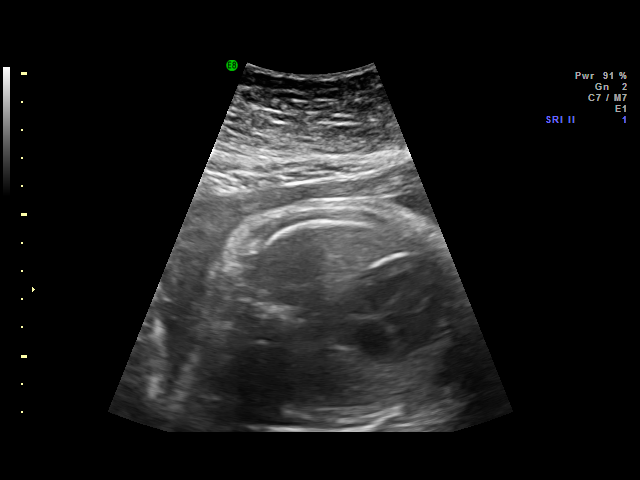
[im 15/27]
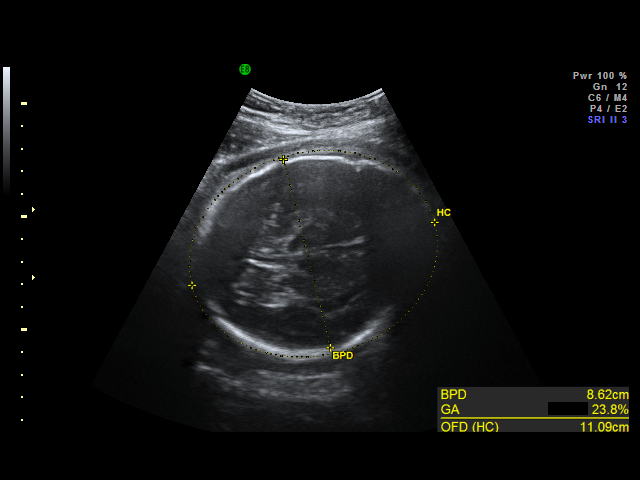
[im 16/27]
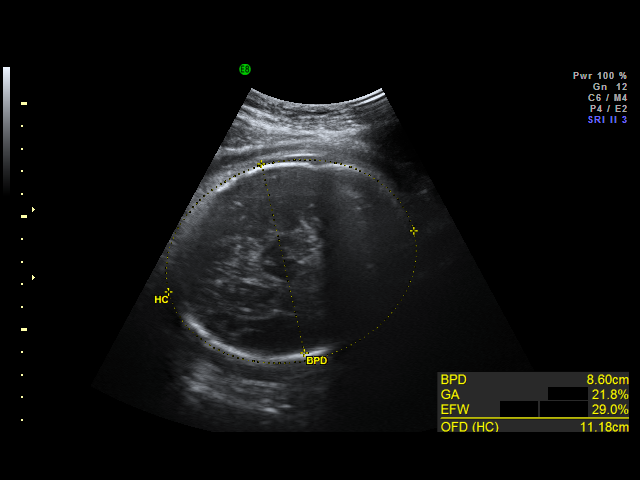
[im 18/27]
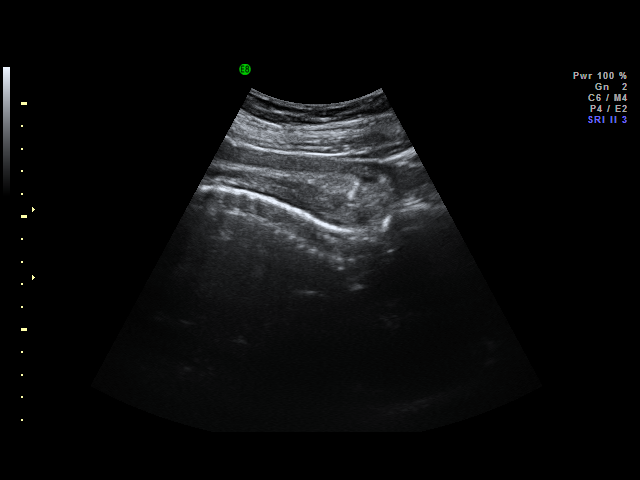
[im 19/27]
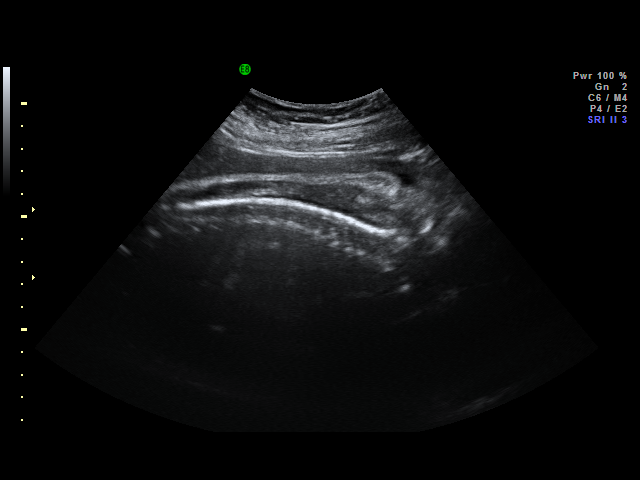
[im 21/27]
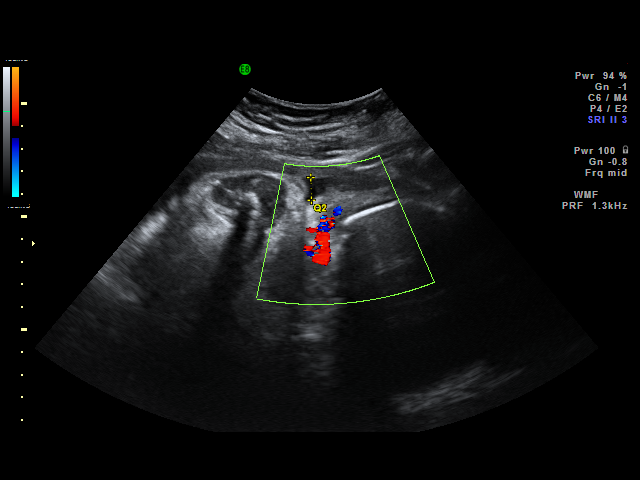
[im 22/27]
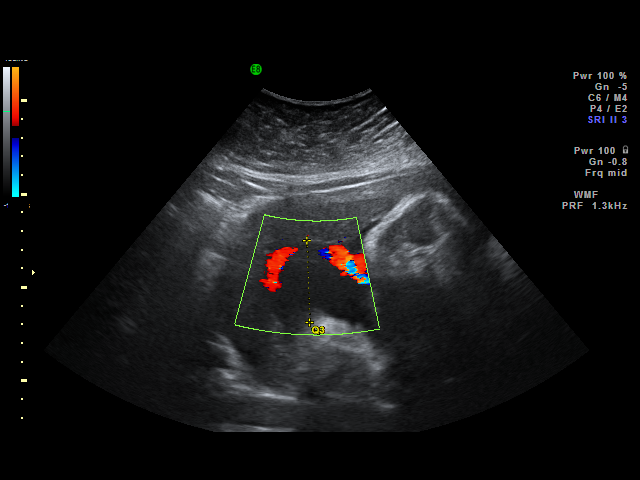
[im 24/27]
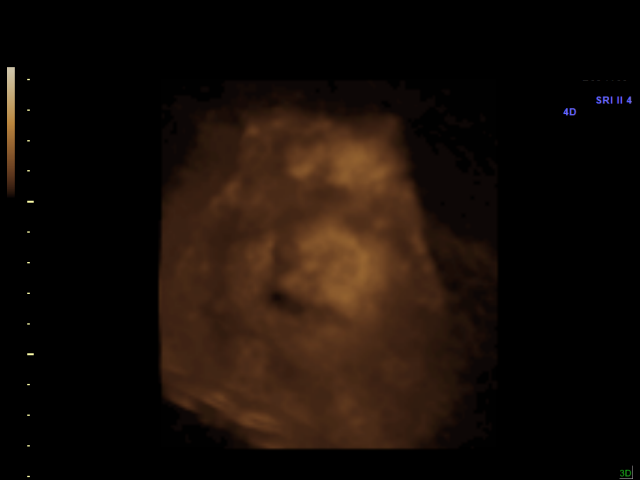
[im 25/27]
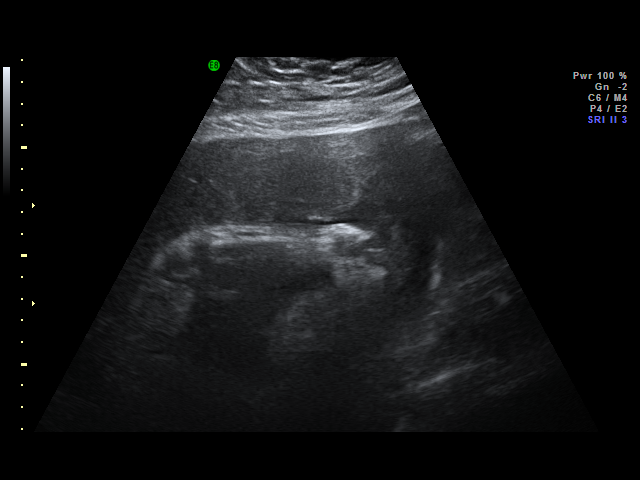
[im 27/27]
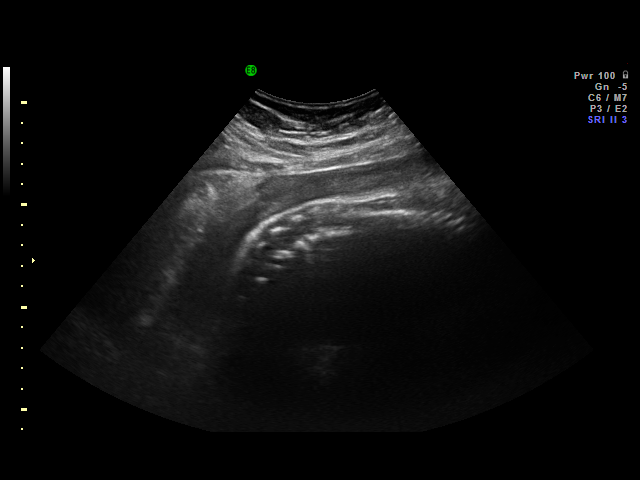

[18 of 27 positions shown; findings below may reference images not displayed]

IMPRESSION: AS OB/GYN has also been faxed to the ordering physician.

## 2009-04-28 ENCOUNTER — Inpatient Hospital Stay (HOSPITAL_COMMUNITY): Admission: AD | Admit: 2009-04-28 | Discharge: 2009-04-28 | Payer: Self-pay | Admitting: Obstetrics and Gynecology

## 2009-05-03 ENCOUNTER — Ambulatory Visit (HOSPITAL_COMMUNITY): Admission: RE | Admit: 2009-05-03 | Discharge: 2009-05-03 | Payer: Self-pay | Admitting: Obstetrics and Gynecology

## 2009-05-03 IMAGING — US US OB FOLLOW-UP
1 series · 18 of 21 positions shown · non-contrast
Comparison: none

OBSTETRICAL ULTRASOUND:
 This ultrasound was performed in The [HOSPITAL], and the AS OB/GYN report will be stored to [REDACTED] PACS.

[Series 1: us ob follow-up · 18 of 21 slices shown]
[im 1/21]
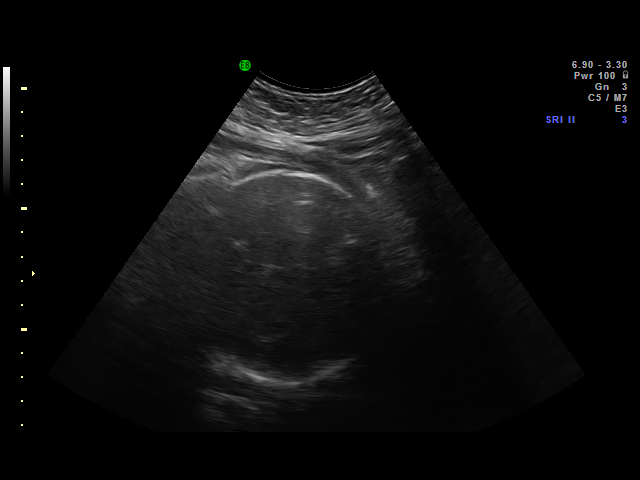
[im 2/21]
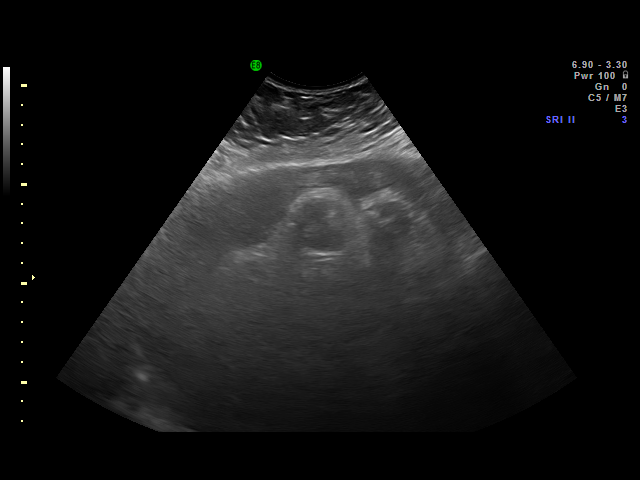
[im 3/21]
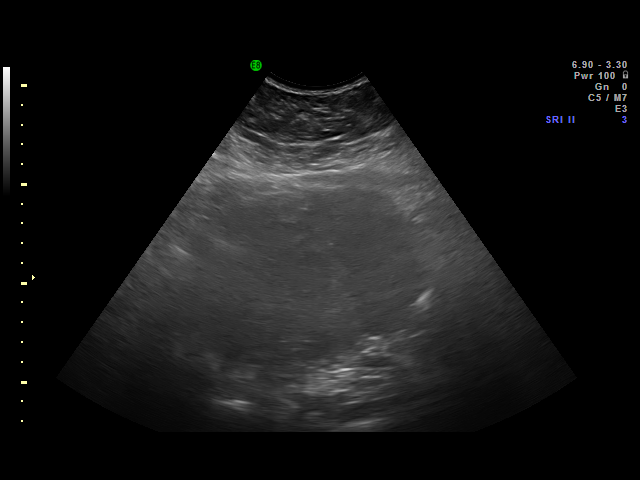
[im 5/21]
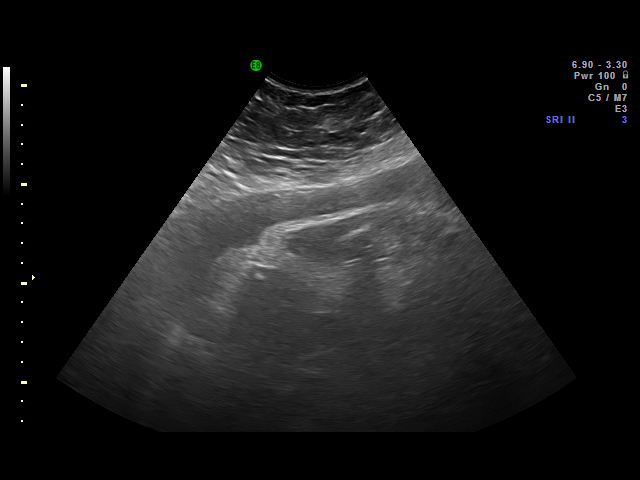
[im 6/21]
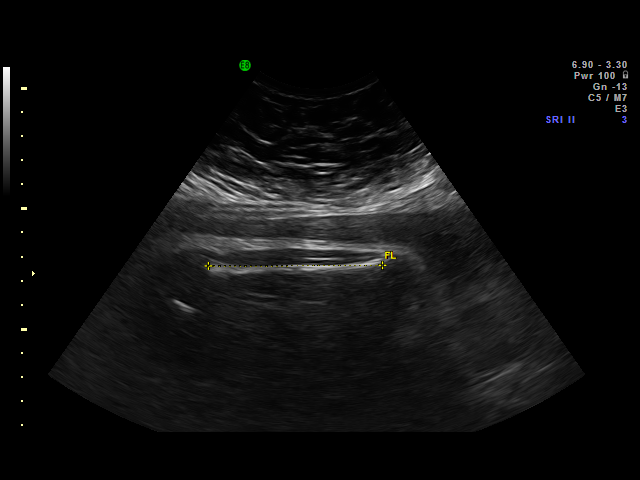
[im 7/21]
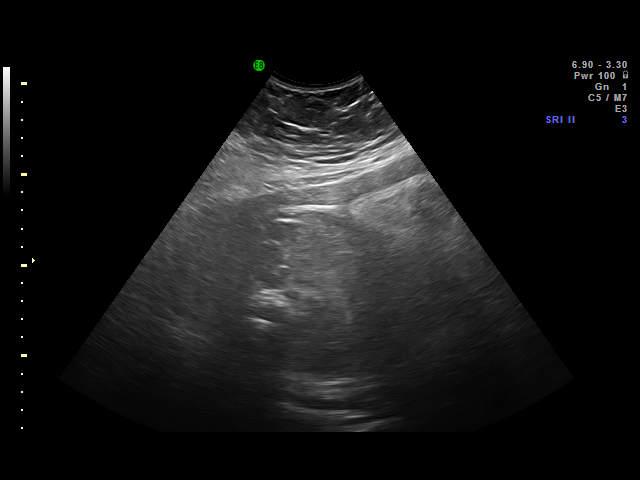
[im 8/21]
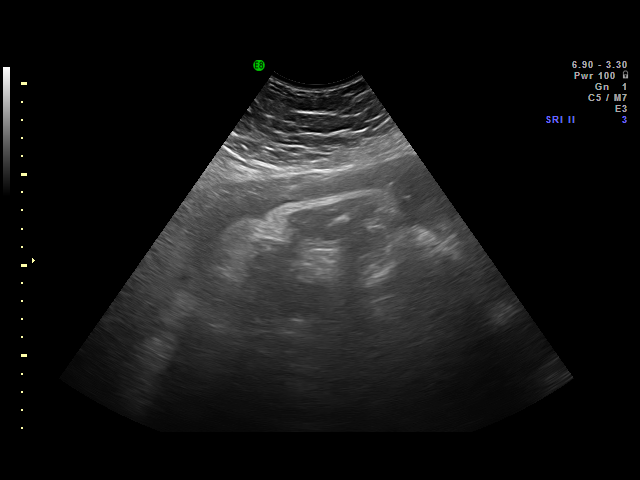
[im 9/21]
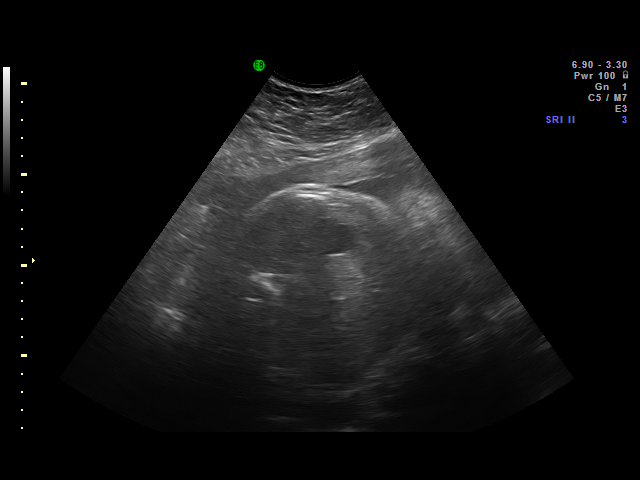
[im 10/21]
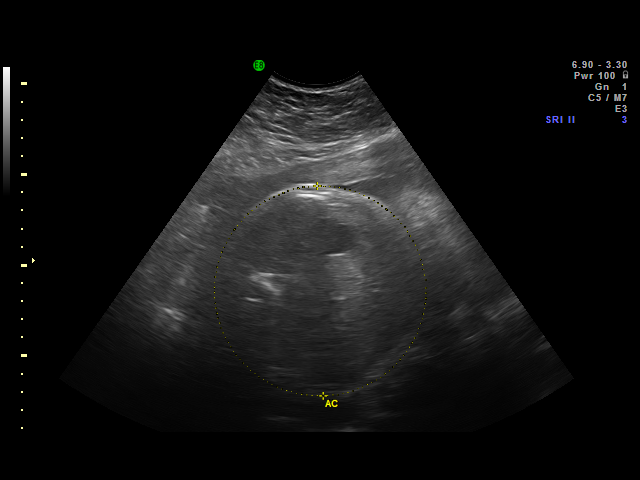
[im 12/21]
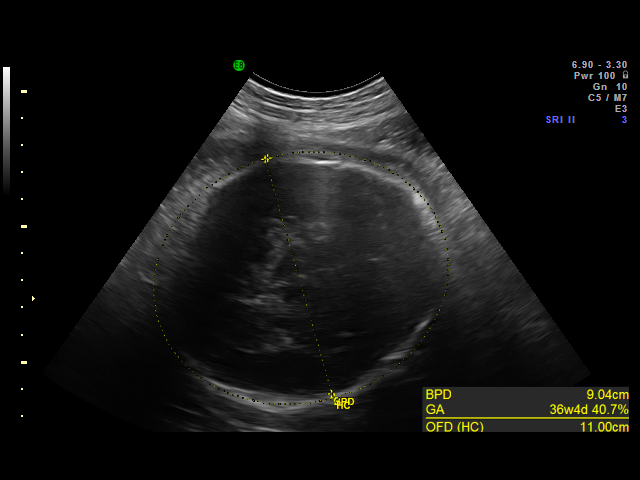
[im 13/21]
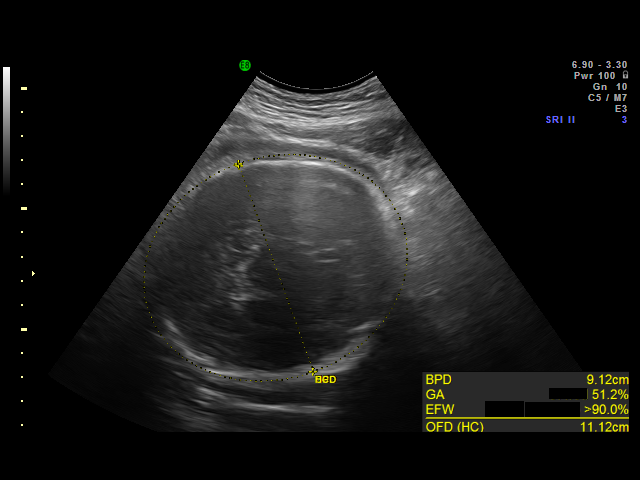
[im 14/21]
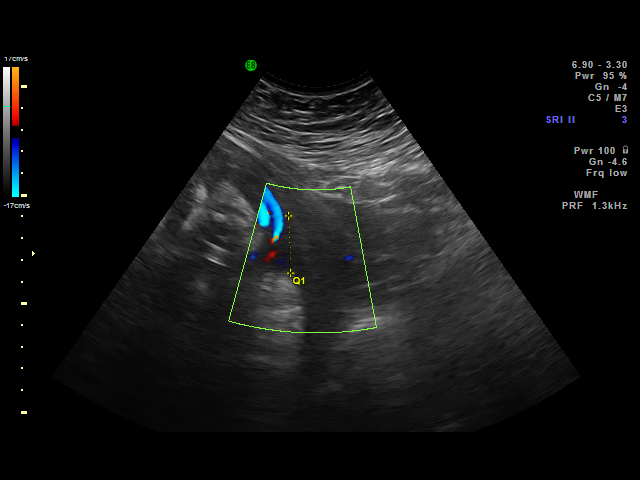
[im 15/21]
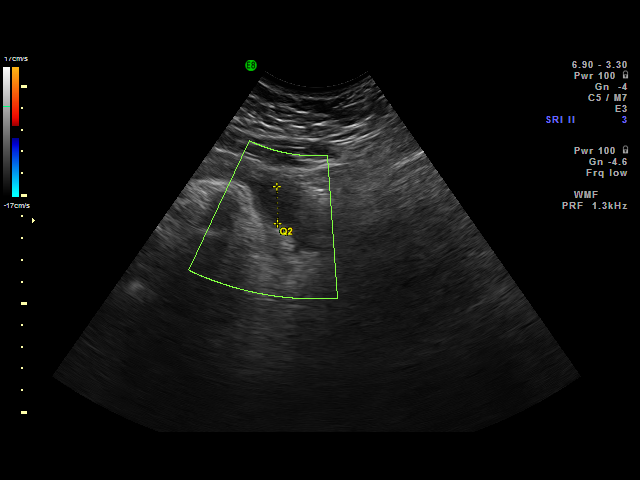
[im 16/21]
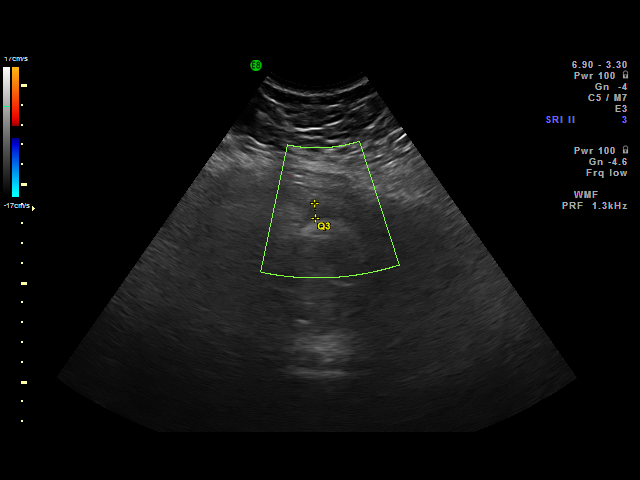
[im 17/21]
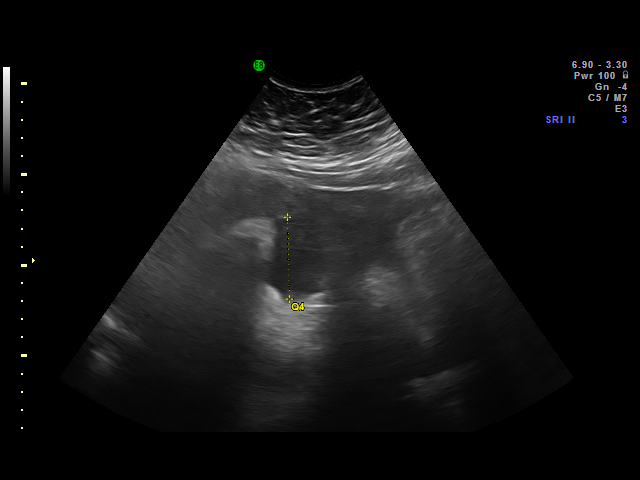
[im 19/21]
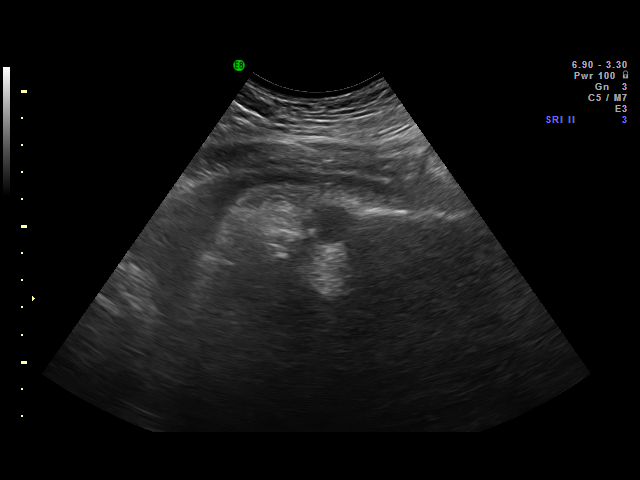
[im 20/21]
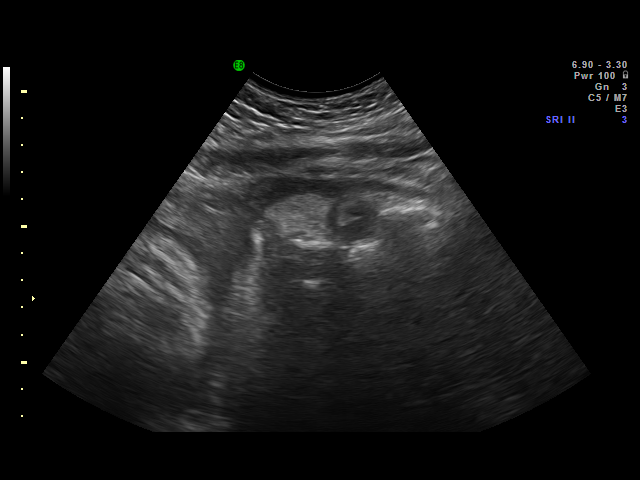
[im 21/21]
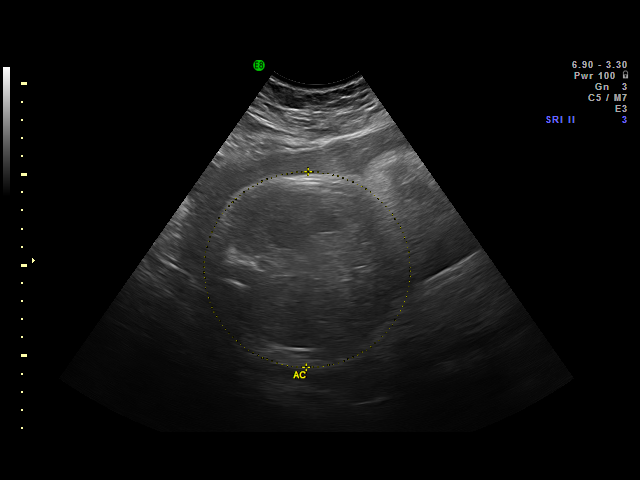

[18 of 21 positions shown; findings below may reference images not displayed]

IMPRESSION: AS OB/GYN has also been faxed to the ordering physician.

## 2009-05-10 ENCOUNTER — Inpatient Hospital Stay (HOSPITAL_COMMUNITY): Admission: AD | Admit: 2009-05-10 | Discharge: 2009-05-13 | Payer: Self-pay | Admitting: Obstetrics and Gynecology

## 2009-06-27 ENCOUNTER — Ambulatory Visit: Admission: RE | Admit: 2009-06-27 | Discharge: 2009-06-27 | Payer: Self-pay | Admitting: Obstetrics and Gynecology

## 2010-06-09 ENCOUNTER — Emergency Department (HOSPITAL_COMMUNITY): Admission: EM | Admit: 2010-06-09 | Discharge: 2010-06-09 | Payer: Self-pay | Admitting: Family Medicine

## 2010-09-26 ENCOUNTER — Encounter
Admission: RE | Admit: 2010-09-26 | Discharge: 2010-09-26 | Payer: Self-pay | Source: Home / Self Care | Attending: Family Medicine | Admitting: Family Medicine

## 2010-09-26 IMAGING — US US SOFT TISSUE HEAD/NECK
1 series · 14 of 25 positions shown · non-contrast
Comparison: None.

CLINICAL DATA: Thyromegaly

ULTRASOUND OF HEAD/NECK SOFT TISSUES
TECHNIQUE: Ultrasound examination of the head and neck soft
tissues was performed in the area of clinical concern.

[Series 1: us soft tissue head/neck · 0.07mm/px · 14 of 31 slices shown]
[im 1/31]
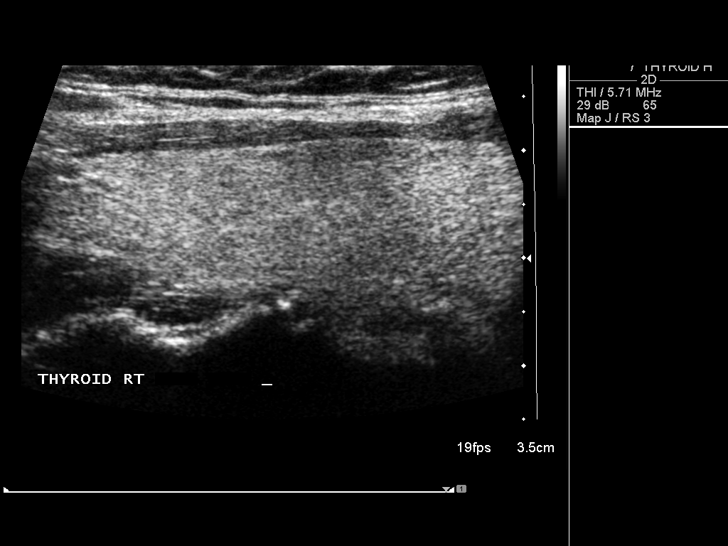
[im 3/31]
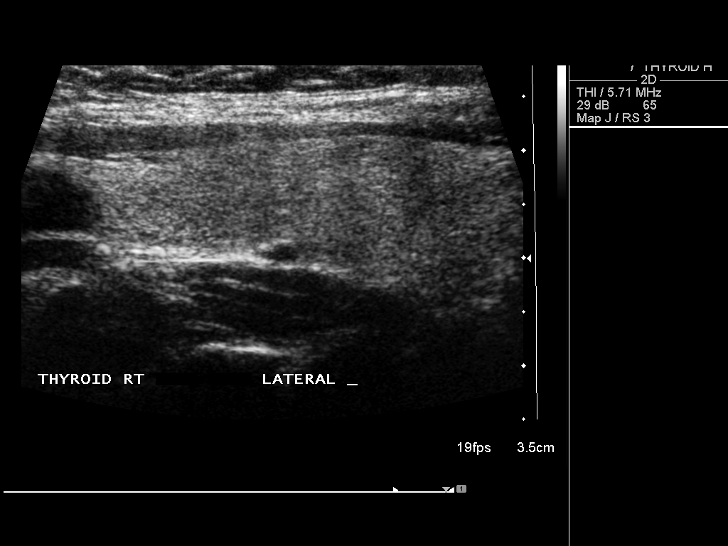
[im 6/31]
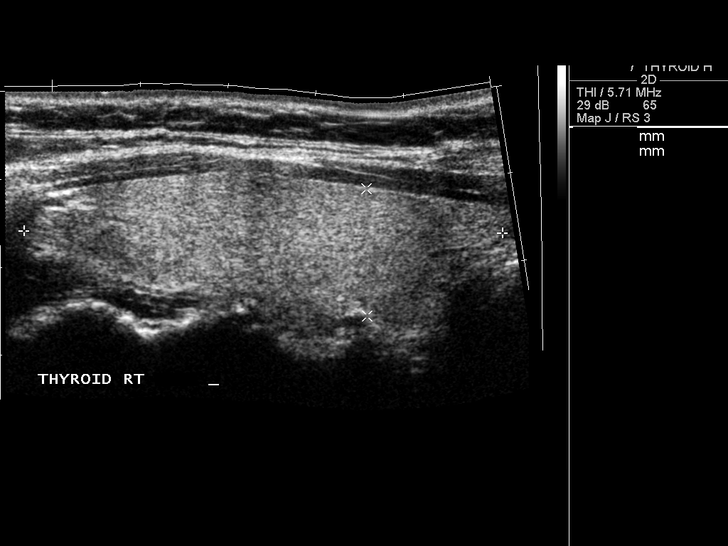
[im 8/31]
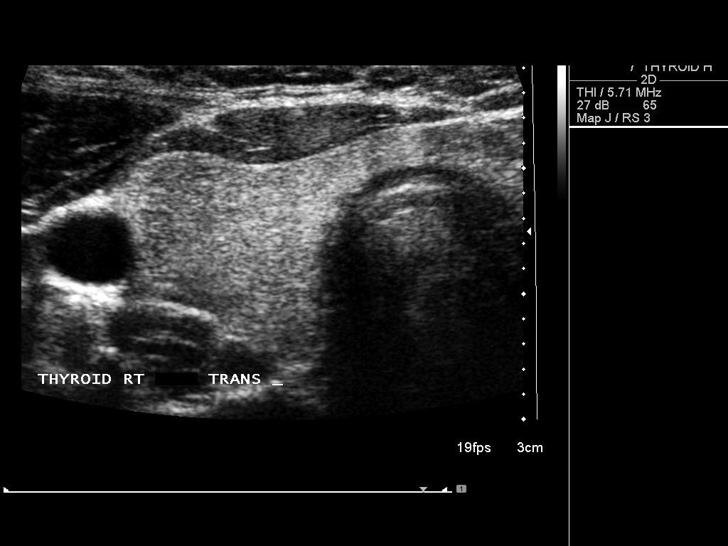
[im 11/31]
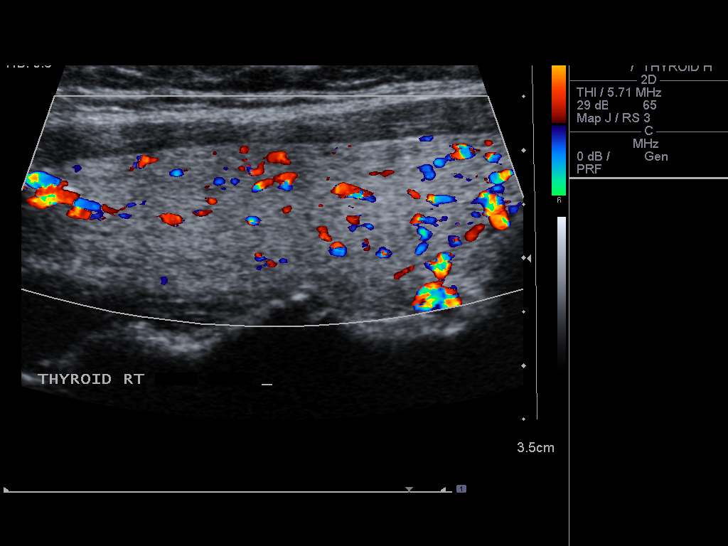
[im 12/31]
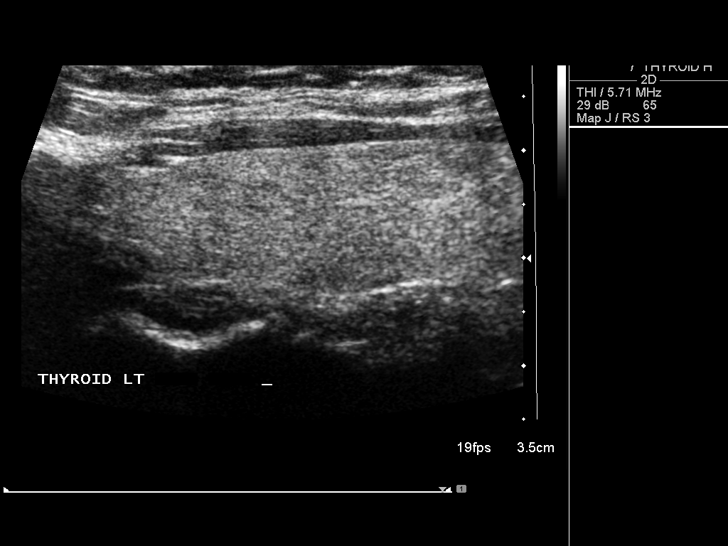
[im 14/31]
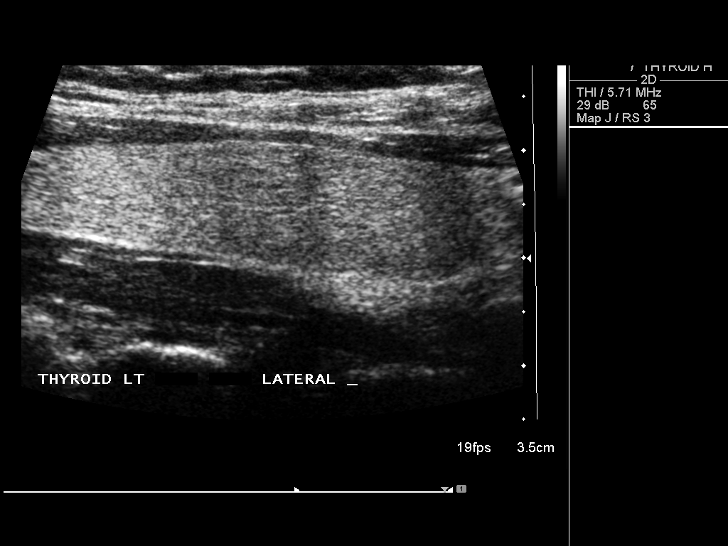
[im 17/31]
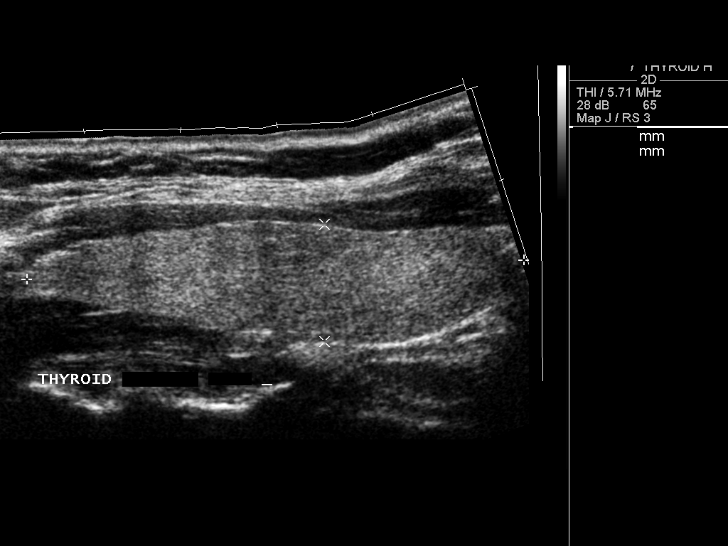
[im 19/31]
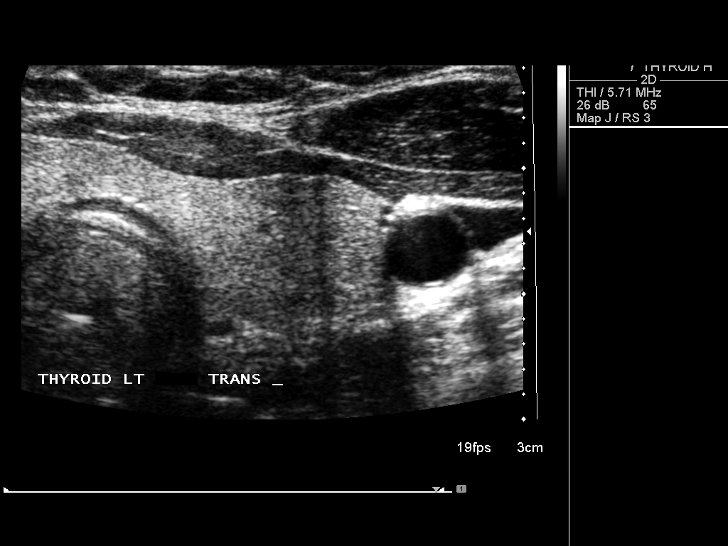
[im 21/31]
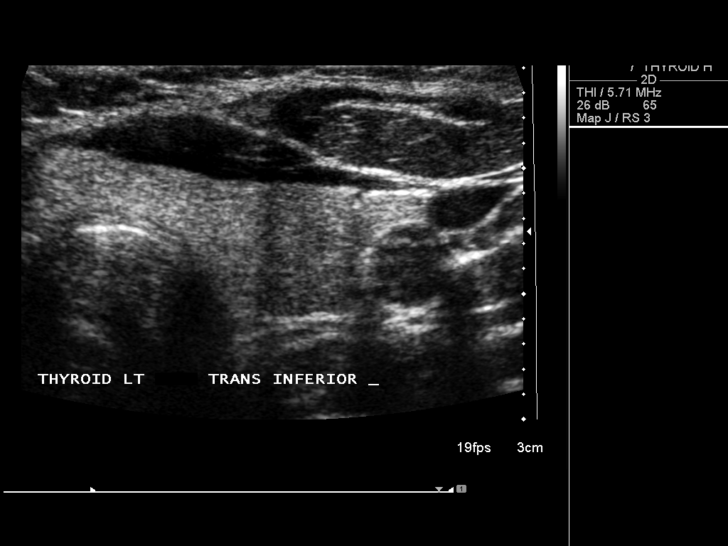
[im 23/31]
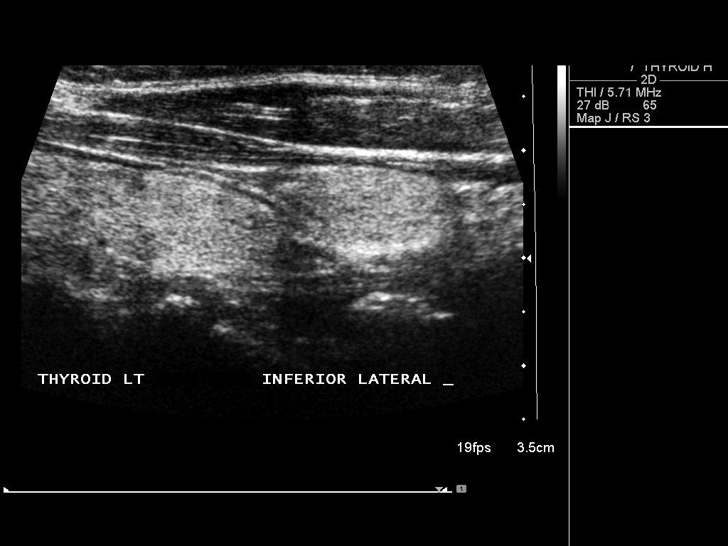
[im 26/31]
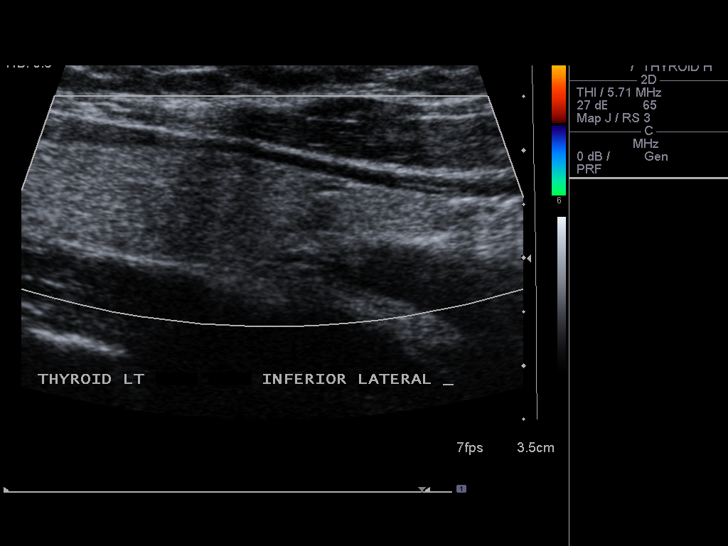
[im 28/31]
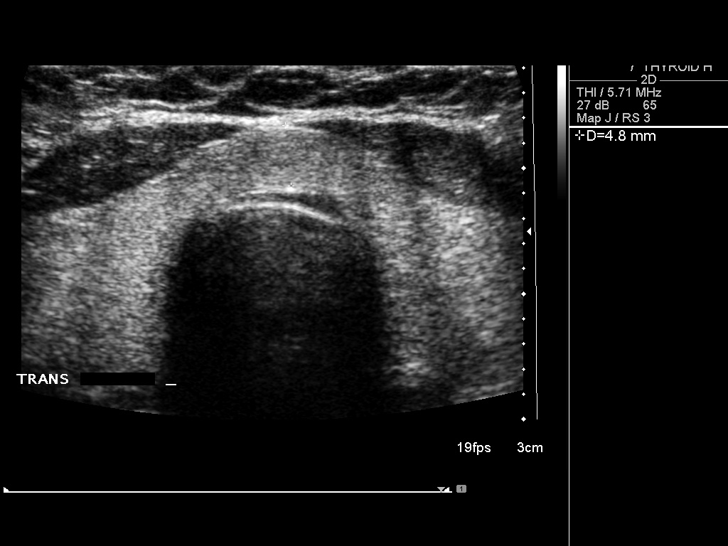
[im 31/31]
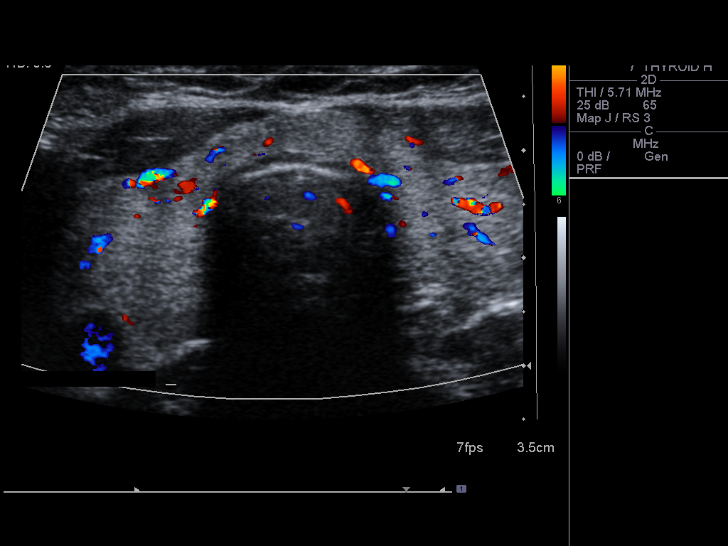

[14 of 25 positions shown; findings below may reference images not displayed]

FINDINGS: The right lobe measures 5.4 x 1.5 x 2.0 cm.  The left
lobe measures 5.1 x 1.2 x 1.8 cm.  The isthmus measures 0.5 cm.
The thyroid echotexture is homogeneous.  No focal lesions
identified.
IMPRESSION: 1.  Normal thyroid sonogram.

## 2010-10-07 NOTE — Medication Information (Signed)
Summary: Approval of Xenical Capsule/[employer]  Approval of Xenical Capsule/[employer]   Imported By: Maryln Gottron 06/22/2008 13:58:58  _____________________________________________________________________  External Attachment:    Type:   Image     Comment:   External Document

## 2010-10-07 NOTE — Progress Notes (Signed)
Summary: denied  Phone Note Other Incoming   Call placed by: [employer] Call placed to: Dontrell Stuck Summary of Call: [employer] denied xenical. Initial call taken by: Celine Ahr,  November 13, 2008 8:55 AM
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
## 2010-10-07 NOTE — Assessment & Plan Note (Signed)
Summary: NEW PT OV/CCM   Vital Signs:  Patient Profile:   35 Years Old Female Height:     70 inches Weight:      349 pounds Temp:     98.2 degrees F oral Pulse rate:   72 / minute Pulse rhythm:   regular BP sitting:   122 / 84  (left arm) Cuff size:   large  Vitals Entered By: Alfred Levins, CMA (September 02, 2007 1:57 PM)                 Chief Complaint:  establish and refill med.  History of Present Illness: here to establish  recent hysteroscopy and d and c for mennorhagia. performed 08/18/07 ---so far good results. 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 She states that "i can't quit eating"  Current Allergies: No known allergies   Past Medical History:    mennorhagia    Anemia-NOS  Past Surgical History:    d and c   2008   Family History:    father-DM    mother -gout    Family History Diabetes 1st degree relative  Social History:    Occupation:-[employer]    Single    Never Smoked    Alcohol use-yes    Regular exercise-no   Risk Factors:  Tobacco use:  never Alcohol use:  yes    Drinks per day:  <1    Counseled to quit/cut down alcohol use:  no Exercise:  no  PAP Smear History:     Date of Last PAP Smear:  06/08/2007    Results:  normal    Review of Systems       no other complaints in a complete ROS    Physical Exam  General:     Well-developed,well-nourished,in no acute distress; alert,appropriate and cooperative throughout examination Head:     normocephalic and atraumatic.   Eyes:     pupils equal and pupils round.   Ears:     R ear normal.   Nose:     no external deformity.   Neck:     No deformities, masses, or tenderness noted. Lungs:     Normal respiratory effort, chest expands symmetrically. Lungs are clear to auscultation, no crackles or wheezes. Heart:     Normal rate and regular rhythm. S1 and S2 normal without gallop, murmur, click, rub or other extra  sounds. Abdomen:     Bowel sounds positive,abdomen soft and non-tender without masses, organomegaly or hernias noted.  morbidly obese Msk:     No deformity or scoliosis noted of thoracic or lumbar spine.   Pulses:     R and L carotid,radial,femoral,dorsalis pedis and posterior tibial pulses are full and equal bilaterally Neurologic:     No cranial nerve deficits noted. Station and gait are normal.. Sensory, motor and coordinative functions appear intact. Skin:     Intact without suspicious lesions or rashes Psych:     Cognition and judgment appear intact. Alert and cooperative with normal attention span and concentration. No apparent delusions, illusions, hallucinations    Impression & Recommendations:  Problem # 1:  ONYCHOMYCOSIS, TOENAILS (ICD-110.1) continue lamisil for total of 12 weeks Her updated medication list for this problem includes:    Lamisil 250 Mg Tabs (Terbinafine hcl) .Marland Kitchen... 1 by mouth once daily   Problem # 2:  MENORRHAGIA (ICD-626.2) check CBC sxs have resolved  Problem # 3:  OBESITY (ICD-278.00) she has had some success  with various diet discussed xenical---would be ok to use needs to limit herself to 1200 Cal diet.  Orders: TLB-TSH (Thyroid Stimulating Hormone) (84443-TSH) check glucose   Problem # 4:  ANEMIA-NOS (ICD-285.9)  Orders: TLB-CBC Platelet - w/Differential (85025-CBCD)   Complete Medication List: 1)  Lamisil 250 Mg Tabs (Terbinafine hcl) .Marland Kitchen.. 1 by mouth once daily 2)  Alli 60 Mg Caps (Orlistat) .Marland Kitchen.. 1 by mouth three times a day  Other Orders: UA Dipstick w/o Micro (81002) Venipuncture ([address]) TLB-BMP (Basic Metabolic Panel-BMET) (80048-METABOL) TLB-Hepatic/Liver Function Pnl (80076-HEPATIC) TLB-Lipid Panel (80061-LIPID)     ] Laboratory Results   Urine Tests    Routine Urinalysis   Color: yellow Appearance: Clear Glucose: negative   (Normal Range: Negative) Bilirubin: negative   (Normal Range: Negative) Ketone:  negative   (Normal Range: Negative) Spec. Gravity: 1.025   (Normal Range: 1.003-1.035) Blood: negative   (Normal Range: Negative) pH: 7.0   (Normal Range: 5.0-8.0) Protein: negative   (Normal Range: Negative) Urobilinogen: 0.2   (Normal Range: 0-1) Nitrite: negative   (Normal Range: Negative) Leukocyte Esterace: negative   (Normal Range: Negative)    Comments: ...................................................................Milica Zimonjic  September 02, 2007 2:06 PM       Preventive Care Screening  Pap Smear:    Date:  06/08/2007    Results:  normal

## 2010-10-07 NOTE — Medication Information (Signed)
Summary: Prior Authorization Request & Denial for Xenical/[facility]  Prior Authorization Request & Denial for Xenical/[facility]   Imported By: Maryln Gottron 01/17/2009 12:19:37  _____________________________________________________________________  External Attachment:    Type:   Image     Comment:   External Document

## 2010-10-07 NOTE — Progress Notes (Signed)
Summary: RF-lamisil  Phone Note Call from Patient Call back at Home Phone [phone]   Caller: Patient-LIVE CALL Call For: DR SWORDS Summary of Call: RF LAMISIL AT [facility]. Initial call taken by: Warnell Forester,  September 19, 2007 10:39 AM  Follow-up for Phone Call        Rx done electronically Patient notified. Done X 1 month only.  Let us know if think need more. Follow-up by: Gladis Riffle, RN,  September 19, 2007 3:02 PM      Prescriptions: LAMISIL 250 MG  TABS (TERBINAFINE HCL) 1 by mouth once daily  #30 x 0   Entered by:   Gladis Riffle, RN   Authorized by:   Birdie Sons MD   Signed by:   Gladis Riffle, RN on 09/19/2007   Method used:   Electronically sent to ...       [facility]  [address]  [phone]*       [address]       [place], [place]  [address]       Ph: [phone] or [phone]       Fax: [phone]   RxID:   [id number]

## 2010-10-07 NOTE — Progress Notes (Signed)
Summary: rx refill  Phone Note Call from Patient Call back at [phone]   Caller: pt live Call For: Andy Moye Reason for Call: Referral Summary of Call: Patient needs rx refill for xenical 120mg .  [facility]. Initial call taken by: Celine Ahr,  July 20, 2008 9:07 AM  Follow-up for Phone Call        On pt med list dated 06/18/08, quantity unknown. Sid Falcon LPN  July 20, 2008 9:12 AM       Prescriptions: XENICAL 120 MG CAPS (ORLISTAT) 1 by mouth with largest meal of day  #30 x 0   Entered by:   Willy Eddy, LPN   Authorized by:   Birdie Sons MD   Signed by:   Willy Eddy, LPN on 45/40/9811   Method used:   Electronically to        [facility]  [phone]* (retail)       [address]       [place], [place]  [address]       Ph: [phone] or [phone]       Fax: [phone]   RxID:   [id number]

## 2010-10-07 NOTE — Assessment & Plan Note (Signed)
Summary: HEAD CHEST CONGESTION WHEEZING/PS   Vital Signs:  Patient Profile:   35 Years Old Female Height:     70 inches Temp:     98.5 degrees F Pulse rate:   80 / minute BP sitting:   120 / 78  (left arm)  Vitals Entered By: Gladis Riffle, RN (June 18, 2008 10:06 AM)                 Chief Complaint:  c/o head congestion, ears popping, nasal burning, cough and slight wheeze--discuaa lamisil and alli , and URI symptoms.  History of Present Illness:  URI Symptoms      This is a 35 year old woman who presents with URI symptoms.  The patient reports nasal congestion and dry cough, but denies clear nasal discharge, purulent nasal discharge, sore throat, productive cough, earache, and sick contacts.  The patient denies fever, stiff neck, dyspnea, and wheezing.  The patient denies itchy watery eyes, itchy throat, sneezing, seasonal symptoms, response to antihistamine, headache, muscle aches, and severe fatigue.  The patient denies the following risk factors for Strep sinusitis: unilateral facial pain, unilateral nasal discharge, poor response to decongestant, double sickening, tooth pain, Strep exposure, tender adenopathy, and absence of cough.   she hears a slight wheeze and hears a popping in ears at times    Prior Medication List:  LAMISIL 250 MG  TABS (TERBINAFINE HCL) 1 by mouth once daily ALLI 60 MG  CAPS (ORLISTAT) 1 by mouth three times a day   Current Allergies (reviewed today): No known allergies   Past Medical History:    Reviewed history from 09/02/2007 and no changes required:       mennorhagia       Anemia-NOS  Past Surgical History:    Reviewed history from 09/02/2007 and no changes required:       d and c   2008   Family History:    Reviewed history from 09/02/2007 and no changes required:       father-DM       mother -gout       Family History Diabetes 1st degree relative  Social History:    Reviewed history from 09/02/2007 and no changes required:    Occupation:-[employer]       Single       Never Smoked       Alcohol use-yes       Regular exercise-no    Review of Systems       no other complaints in a complete ROS    Physical Exam  General:     Well-developed,well-nourished,in no acute distress; alert,appropriate and cooperative throughout examination Head:     normocephalic and atraumatic.   Eyes:     pupils equal and pupils round.   Ears:     R ear normal and L ear normal.   Nose:     no external deformity and no external erythema.   Mouth:     Oral mucosa and oropharynx without lesions or exudates.  Teeth in good repair. Neck:     No deformities, masses, or tenderness noted. Chest Wall:     No deformities, masses, or tenderness noted. Lungs:      bilateral wheeze with forced expirationnormal respiratory effort, no intercostal retractions, and no accessory muscle use.   Heart:     normal rate, regular rhythm, and no murmur.      Impression & Recommendations:  Problem # 1:  URI (ICD-465.9) she does have  evidence of bronchospasm check CXR albuterol MDI---samples Orders: T-2 View CXR (71020TC)   Problem # 2:  OBESITY (ICD-278.00) discussed need for diet exercise, weight loss she is given a prescription for xenical - side effects discussed she needs to exercise every day for at least one hour  Complete Medication List: 1)  Lamisil 250 Mg Tabs (Terbinafine hcl) .Marland Kitchen.. 1 by mouth once daily 2)  Alli 60 Mg Caps (Orlistat) .Marland Kitchen.. 1 by mouth three times a day 3)  Xenical 120 Mg Caps (Orlistat) .Marland Kitchen.. 1 by mouth with largest meal of day    ]

## 2010-10-07 NOTE — Progress Notes (Signed)
Summary: Patient wants to switch physicians  Phone Note Call from Patient Call back at Home Phone [phone]   Caller: Patient Call For: Birdie Sons MD Summary of Call: Patient would like to switch from Dr. Cato Mulligan to Dr. Fabian Sharp.  She is pregnant and wants her infant to see Panosh as well.   Initial call taken by: Barnie Mort,  Jan 31, 2009 3:08 PM  Follow-up for Phone Call        ok with me Follow-up by: Birdie Sons MD,  Jan 31, 2009 4:58 PM  Additional Follow-up for Phone Call Additional follow up Details #1::        ok  please record   [facility] and name of her OB in the record.  Additional Follow-up by: Madelin Headings MD,  Feb 04, 2009 4:18 PM    Additional Follow-up for Phone Call Additional follow up Details #2::    [facility] Follow-up by: Barnie Mort,  February 13, 2009 10:52 AM

## 2010-10-07 NOTE — Progress Notes (Signed)
Summary: [employer] approved Xenecial till 10-17-2008   Phone Note From Pharmacy   Caller: [facility] Call For: Lindsey Bowman  Summary of Call: Xenical approved till 10-17-2008 by [employer]  Initial call taken by: Roselle Locus,  June 19, 2008 4:31 PM

## 2010-12-12 LAB — COMPREHENSIVE METABOLIC PANEL
ALT: 15 U/L (ref 0–35)
AST: 24 U/L (ref 0–37)
Albumin: 2.9 g/dL — ABNORMAL LOW (ref 3.5–5.2)
Alkaline Phosphatase: 132 U/L — ABNORMAL HIGH (ref 39–117)
BUN: 8 mg/dL (ref 6–23)
CO2: 23 mEq/L (ref 19–32)
Calcium: 9.3 mg/dL (ref 8.4–10.5)
Chloride: 107 mEq/L (ref 96–112)
Creatinine, Ser: 0.73 mg/dL (ref 0.4–1.2)
GFR calc Af Amer: >60 mL/min (ref >60)
GFR calc non Af Amer: >60 mL/min (ref >60)
Glucose, Bld: 91 mg/dL (ref 70–99)
Potassium: 4.3 mEq/L (ref 3.5–5.1)
Sodium: 137 mEq/L (ref 135–145)
Total Bilirubin: 0.4 mg/dL (ref 0.3–1.2)
Total Protein: 6 g/dL (ref 6.0–8.3)

## 2010-12-12 LAB — CBC
HCT: 30.7 % — ABNORMAL LOW (ref 36.0–46.0)
HCT: 36.6 % (ref 36.0–46.0)
Hemoglobin: 10.5 g/dL — ABNORMAL LOW (ref 12.0–15.0)
Hemoglobin: 12.3 g/dL (ref 12.0–15.0)
MCHC: 33.5 g/dL (ref 30.0–36.0)
MCHC: 34.3 g/dL (ref 30.0–36.0)
MCV: 90.7 fL (ref 78.0–100.0)
MCV: 91.7 fL (ref 78.0–100.0)
Platelets: 283 10*3/uL (ref 150–400)
Platelets: 354 10*3/uL (ref 150–400)
RBC: 3.35 MIL/uL — ABNORMAL LOW (ref 3.87–5.11)
RBC: 4.04 MIL/uL (ref 3.87–5.11)
RDW: 13.8 % (ref 11.5–15.5)
RDW: 14 % (ref 11.5–15.5)
WBC: 10.3 10*3/uL (ref 4.0–10.5)
WBC: 9.2 10*3/uL (ref 4.0–10.5)

## 2010-12-12 LAB — RPR: RPR Ser Ql: NONREACTIVE

## 2010-12-12 LAB — RH IMMUNE GLOB WKUP(>/=20WKS)(NOT WOMEN'S HOSP): Fetal Screen: NEGATIVE

## 2010-12-12 LAB — LACTATE DEHYDROGENASE: LDH: 142 U/L (ref 94–250)

## 2010-12-12 LAB — URIC ACID: Uric Acid, Serum: 5.6 mg/dL (ref 2.4–7.0)

## 2011-01-20 NOTE — H&P (Signed)
Lindsey Bowman, Lindsey Bowman             ACCOUNT NO.:  [id number]   MEDICAL RECORD NO.:  [id number]          PATIENT TYPE:  AMB   LOCATION:                                FACILITY:  [facility]   PHYSICIAN:  Duke Salvia. Marcelle Overlie, M.D.    DATE OF BIRTH:   DATE OF ADMISSION:  08/16/2007  DATE OF DISCHARGE:                              HISTORY & PHYSICAL   CHIEF COMPLAINT:  Abnormal uterine bleeding.   HISTORY OF PRESENT ILLNESS:  A 35 year old and g 0, p 0, I saw  originally July 15, 2007, with the complaint of heavy irregular  bleeding.  She has been irregular really since age 63.  Of significance,  her weight is 348.  She is not currently sexually active.  When  originally seen, she was placed on OCPs b.i.d. with some blood work and  [facility] scheduled.  SHG showed a 14-mm polyp inside the endometrial cavity,  and lab work showed FSH 5.4, LH 8.8, prolactin 6.4, thyroid normal.  She  presents now for [facility] hysteroscopy.  This procedure including risk of  bleeding, infection, adjacent organ injury, other complications that may  require open additional surgery, all discussed with her which she  understands and accepts.   PAST MEDICAL HISTORY:  Allergies:  None.   CURRENT MEDICATIONS:  Femcon b.i.d.   REVIEW OF SYSTEMS:  Significant for PCOS, a mother who has thyroid  disorder, a father with diabetes.   PHYSICAL EXAM:  VITAL SIGNS:  Temperature 98.2, blood pressure 130/78.  HEENT:  Unremarkable.  NECK:  Supple without masses.  LUNGS:  Clear.  CARDIOVASCULAR:  Regular rate and rhythm without murmurs, rubs, gallops  noted.  BREASTS:  Without masses.  ABDOMEN:  Soft, flat, nontender.  PELVIC:  Normal external genitalia, vagina and cervix clear.  Uterus mid-  positional size, adnexa negative.  EXTREMITIES:  Negative.  NEUROLOGIC:  Negative.   IMPRESSION:  1. Obesity.  2. History of polycystic ovary syndrome.  3. Endometrial polyp with abnormal uterine bleeding.   PLAN:  D&C hysteroscopy.   Procedure and risks reviewed as above.      Richard M. Marcelle Overlie, M.D.  Electronically Signed     RMH/MEDQ  D:  08/12/2007  T:  08/13/2007  Job:  [id number]

## 2011-01-20 NOTE — Op Note (Signed)
NAMECHELCEA, Lindsey Bowman             ACCOUNT NO.:  [id number]   MEDICAL RECORD NO.:  [id number]          PATIENT TYPE:  AMB   LOCATION:  SDC                           [facility]ILITY:  [facility]   PHYSICIAN:  Duke Salvia. Marcelle Overlie, M.D.DATE OF BIRTH:  [birth year]   DATE OF PROCEDURE:  08/18/2007  DATE OF DISCHARGE:                               OPERATIVE REPORT   PREOPERATIVE DIAGNOSES:  1. Abnormal uterine bleeding.  2. Endometrial polyp.   POSTOPERATIVE DIAGNOSES:  1. Abnormal uterine bleeding.  2. Endometrial polyp.   PROCEDURE:  Dilatation and curettage, hysteroscopy.   SURGEON:  Duke Salvia. Marcelle Overlie, M.D.   ANESTHESIA:  MAC plus paracervical block.   ESTIMATED BLOOD LOSS:  Minimal.   SPECIMENS REMOVED:  Endometrial curettings.   PROCEDURE AND FINDINGS:  The patient was taken to the operating room.  After an adequate level of sedation was obtained with the legs in  stirrups, perineum and vagina prepped in the usual manner for D&C.  The  bladder was drained.  The speculum was positioned, the cervix grasped  with a tenaculum.  Paracervical block was then created by infiltrating  at 3 and 9 o'clock submucosally with 5-7 mL of 1% Xylocaine on either  side after negative aspiration.  The uterus was then sounded to 8 cm,  progressively dilated to a 27 Pratt dilator.  The 7-mm continuous-flow  hysteroscope was then used perform hysteroscopy.  Initially there was  some excess buildup of endometrial tissue making visualization  difficult.  Therefore, a sharp curettage was carried out and tissue was  sent to pathology.  Exploration of the cavity with polyp forceps  revealed no additional tissue.  The hysteroscope was then reinserted and  the cavity was irrigated.  A much better view was obtained, revealing  both tubal ostia to be unremarkable.  The shape of the uterus was  otherwise normal and the walls were clean at that point.  She tolerated  this well, went to the recovery room in good  condition.      Richard M. Marcelle Overlie, M.D.  Electronically Signed     RMH/MEDQ  D:  08/18/2007  T:  08/19/2007  Job:  [id number]

## 2011-06-15 LAB — CBC
HCT: 34.1 — ABNORMAL LOW
Hemoglobin: 11.3 — ABNORMAL LOW
MCHC: 33.3
MCV: 88.6
Platelets: 536 — ABNORMAL HIGH
RBC: 3.85 — ABNORMAL LOW
RDW: 13.2
WBC: 6.7

## 2011-06-15 LAB — PREGNANCY, URINE: Preg Test, Ur: NEGATIVE

## 2011-06-15 LAB — NO BLOOD PRODUCTS

## 2011-09-20 LAB — OB RESULTS CONSOLE ABO/RH: RH Type: NEGATIVE

## 2012-02-26 NOTE — H&P (Signed)
Lindsey Bowman, Lindsey Bowman NO.:  [id number]  MEDICAL RECORD NO.:  [id number]  LOCATION:  PERIO                         [facility]ACILITY:  [facility]  PHYSICIAN:  Duke Salvia. Marcelle Overlie, M.D.DATE OF BIRTH:  [birth year]  DATE OF ADMISSION:  02/29/2012 DATE OF DISCHARGE:                             HISTORY & PHYSICAL   CHIEF COMPLAINT:  Missed AB.  HISTORY OF PRESENT ILLNESS:  A 36 year old G2, P1.  This patient was seen initially for new OB appointment at her first ultrasound Jan 28, 2012, 6 weeks 3 days with an EDD of September 19, 2012.  FHR was 144 at that point.  Unfortunately on followup, February 18, 2012, no FHR was noted. She was asymptomatic.  No spotting or cramping at that point.  Requested followup ultrasound which was done this past week and showing no FHR, presents now for D and E of this procedure including risks related to bleeding, infection, other complications such as perforation may require additional surgery reviewed with her.  PAST MEDICAL HISTORY:  Allergies:  None.  Blood type is A negative.  OBSTETRICAL HISTORY:  One vaginal delivery in 2010.  REVIEW OF SYSTEMS:  Significant for prior hysteroscopy, D and C in 2008. Also has a history of positive HSV 2 titer, although she has never clinically had a general herpes.  FAMILY AND SOCIAL HISTORY:  Please see her [employer] form for details.  PHYSICAL EXAMINATION:  VITAL SIGNS:  Temperature 98.2, blood pressure 120/78. HEENT:  Unremarkable. NECK:  Supple without masses. LUNGS:  Clear. CARDIOVASCULAR:  Regular rate and rhythm without murmurs, rubs, gallops. BREASTS:  Without masses. ABDOMEN:  Soft, flat, nontender. PELVIC:  Normal external genitalia.  Vagina and cervix clear.  Uterus was 7-8 week size, mid position.  Adnexa negative. EXTREMITIES/NEUROLOGIC:  Unremarkable.  IMPRESSION:  Missed abortion.  PLAN:  D and E procedure and risks discussed as above.     Aseret Hoffman M. Marcelle Overlie, M.D.     RMH/MEDQ  D:   02/26/2012  T:  02/26/2012  Job:  [id number]

## 2012-02-26 NOTE — H&P (Signed)
ZIAIRE BIESER  DICTATION # [id number] CSN# [id number]   Meriel Pica, MD 02/26/2012 8:32 AM

## 2012-02-27 ENCOUNTER — Encounter (HOSPITAL_COMMUNITY): Payer: Self-pay

## 2012-02-27 ENCOUNTER — Encounter (HOSPITAL_COMMUNITY): Payer: Self-pay | Admitting: Pharmacist

## 2012-02-27 ENCOUNTER — Inpatient Hospital Stay (HOSPITAL_COMMUNITY)
Admission: AD | Admit: 2012-02-27 | Discharge: 2012-02-27 | Disposition: A | Discharge status: Home / Self Care | Payer: 59 | Source: Ambulatory Visit | Attending: Obstetrics and Gynecology | Admitting: Obstetrics and Gynecology

## 2012-02-27 ENCOUNTER — Inpatient Hospital Stay (HOSPITAL_COMMUNITY): Payer: 59

## 2012-02-27 DIAGNOSIS — O039 Complete or unspecified spontaneous abortion without complication: Secondary | ICD-10-CM | POA: Insufficient documentation

## 2012-02-27 HISTORY — DX: Herpesviral infection, unspecified: B00.9

## 2012-02-27 HISTORY — DX: Polyp of corpus uteri: N84.0

## 2012-02-27 HISTORY — DX: Other specified health status: Z78.9

## 2012-02-27 IMAGING — US US OB COMP LESS 14 WK
1 series · 14 of 28 positions shown · non-contrast
Comparison: None.

CLINICAL DATA: heavy vag bleeding, ? passed pregnancy,Pain; ;

OBSTETRIC <14 WK US AND TRANSVAGINAL OB US
TECHNIQUE: Both transabdominal and transvaginal ultrasound
examinations were performed for complete evaluation of the
gestation as well as the maternal uterus, adnexal regions, and
pelvic cul-de-sac.

[Series 1: us ob transvaginal · 14 of 64 slices shown]
[im 3/64]
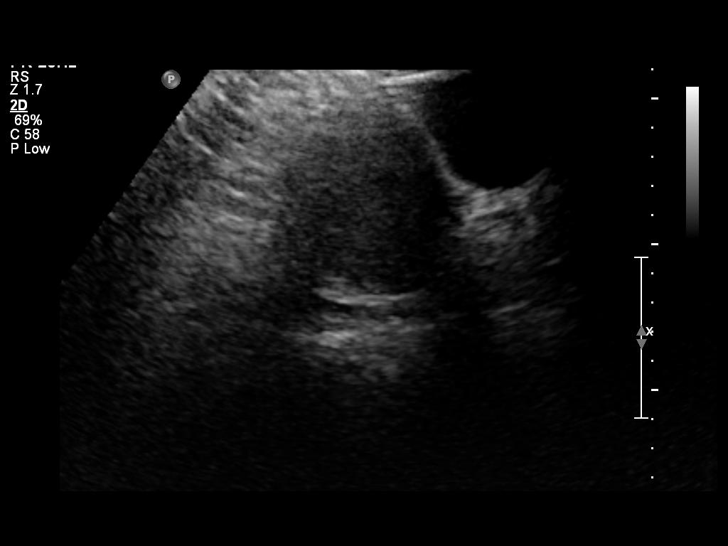
[im 8/64]
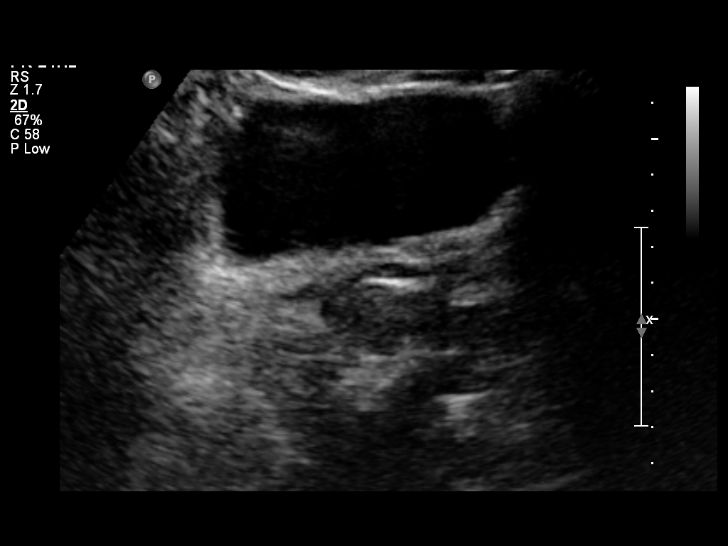
[im 12/64]
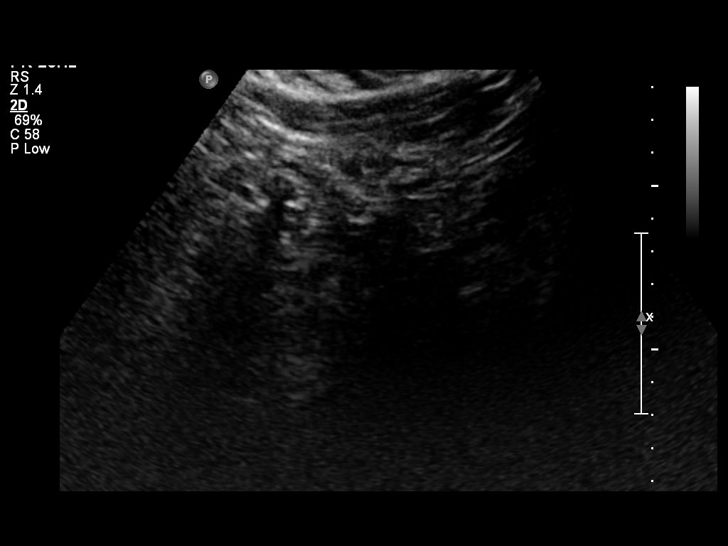
[im 17/64]
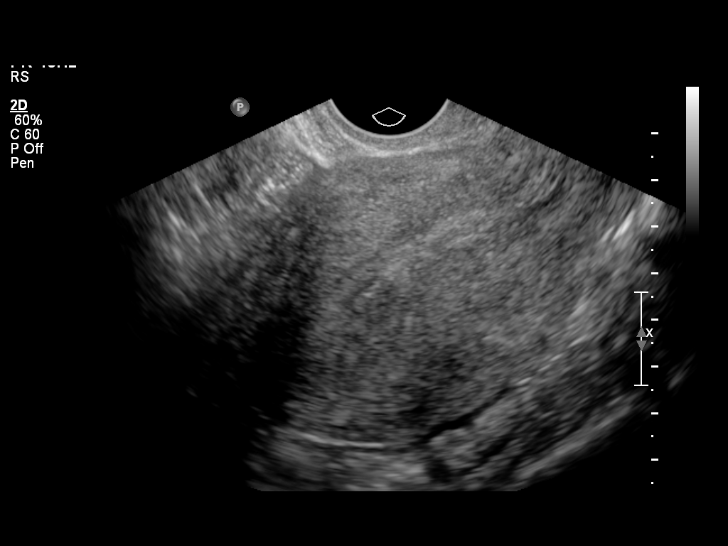
[im 22/64]
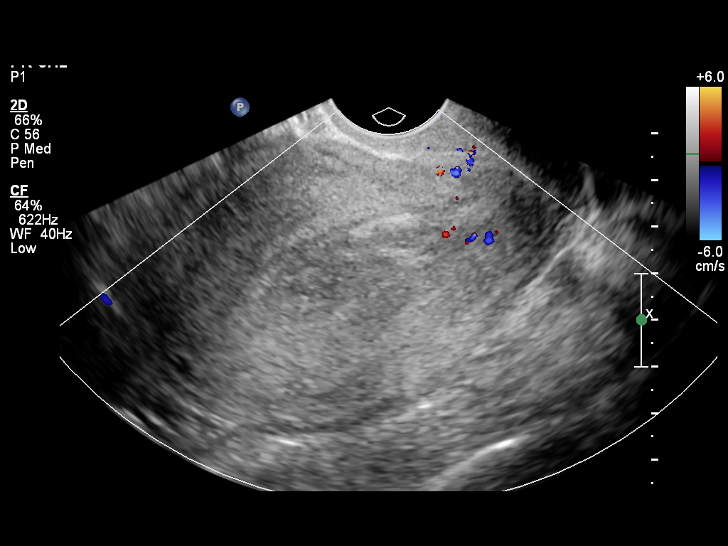
[im 26/64]
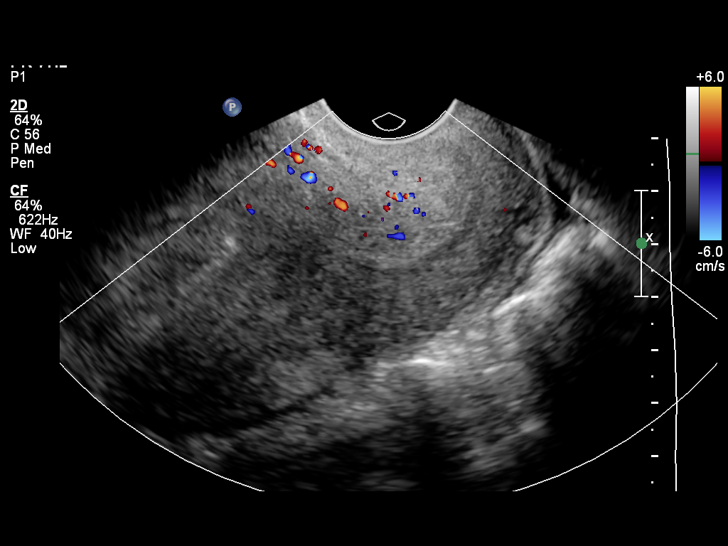
[im 31/64]
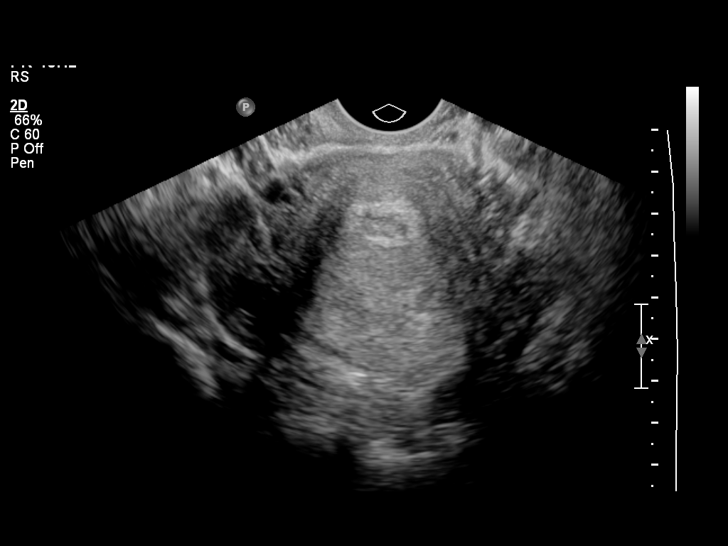
[im 36/64]
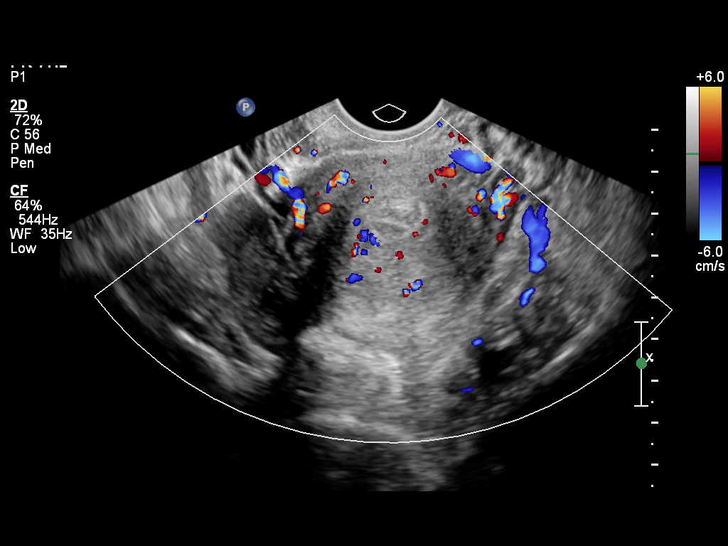
[im 40/64]
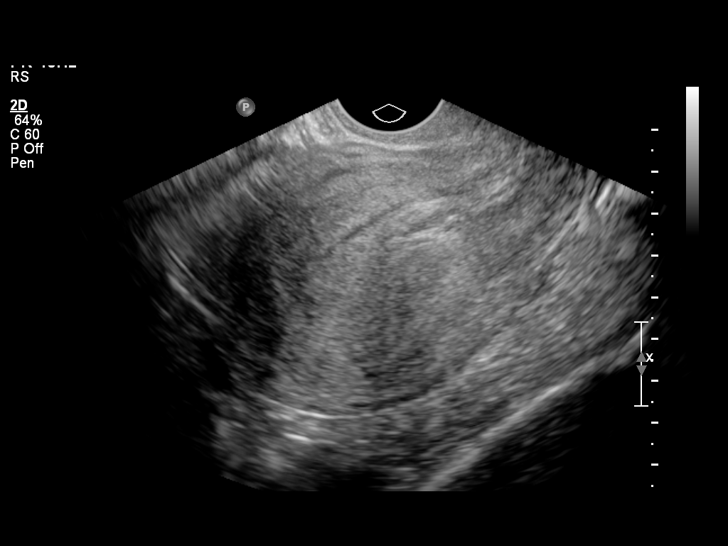
[im 45/64]
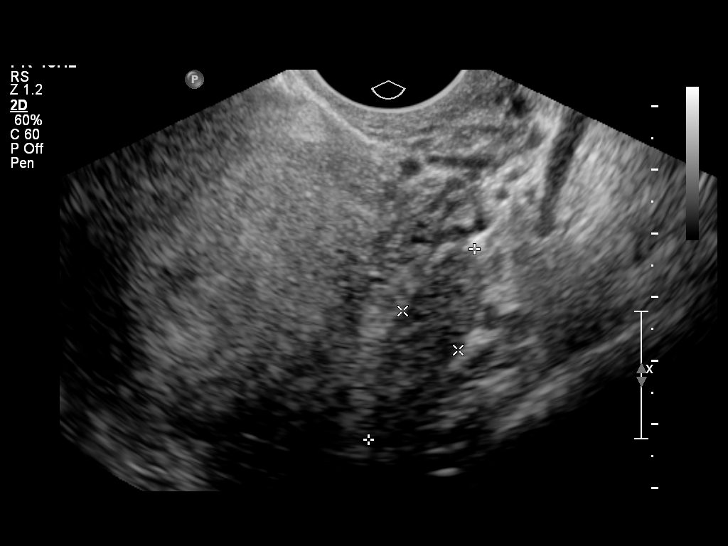
[im 50/64]
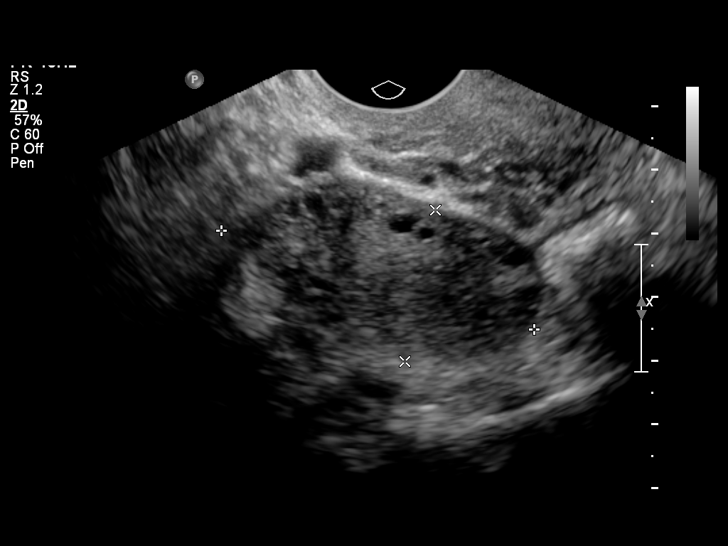
[im 54/64]
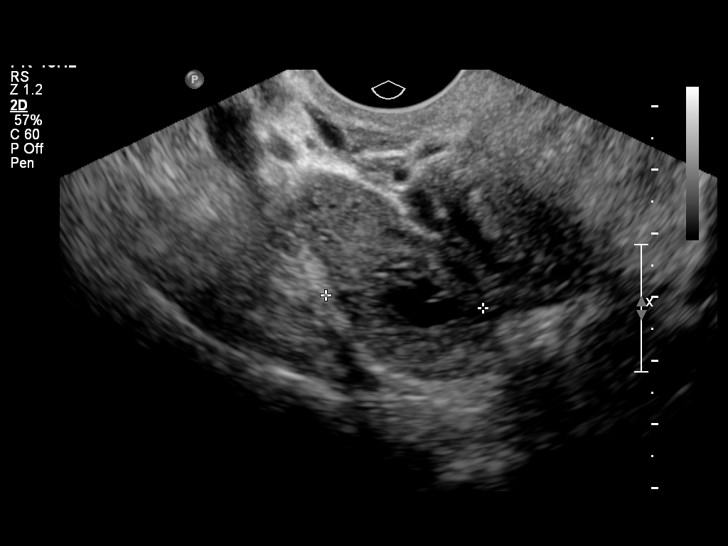
[im 59/64]
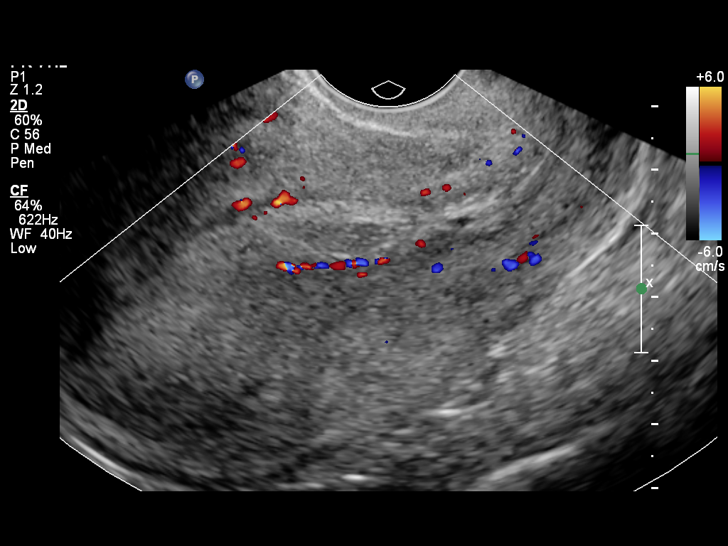
[im 64/64]
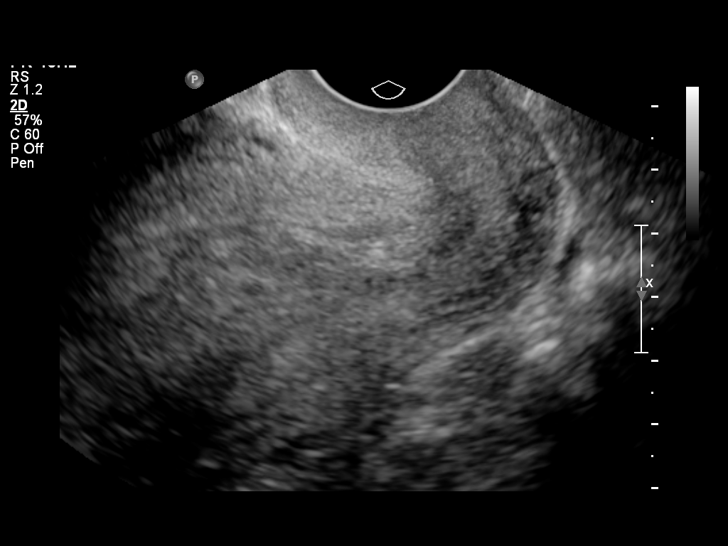

[14 of 28 positions shown; findings below may reference images not displayed]

FINDINGS: No intrauterine gestation visualized.  Endometrium is
thickened, measuring 16 mm.

Ovaries are symmetric in size and echotexture.  Small right corpus
healed cyst present.  No adnexal masses or free fluid.
IMPRESSION: No intrauterine pregnancy identified. Mildly thickened and
heterogeneous endometrium.  Recommend correlation with quantitative
beta HCG level.  This could represent intrauterine pregnancy too
early to visualize, spontaneous abortion, or occult ectopic
pregnancy.

## 2012-02-27 MED ORDER — RHO D IMMUNE GLOBULIN 1500 UNIT/2ML IJ SOLN
300.0000 ug | Freq: Once | INTRAMUSCULAR | Status: AC
  Administered 2012-02-27: 300 ug via INTRAMUSCULAR
  Filled 2012-02-27: qty 2

## 2012-02-27 MED ORDER — BUTALBITAL-APAP-CAFFEINE 50-325-40 MG PO TABS
1.0000 | ORAL_TABLET | Freq: Once | ORAL | Status: AC
  Administered 2012-02-27: 1 via ORAL
  Filled 2012-02-27: qty 1

## 2012-02-27 NOTE — Discharge Instructions (Signed)
CALL DR. HOLLAND'S OFFICE ON Monday TO LET THEM KNOW YOU WERE HERE TODAY. RETURN IF YOU HAVE PROBLEMS.
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
## 2012-02-27 NOTE — MAU Note (Signed)
Onset of heavy vaginal bleeding with abdominal pain started today patient is scheduled for D&C on Monday, pt was told 2 weeks ago that no fhr, missed AB

## 2012-02-28 LAB — RH IG WORKUP (INCLUDES ABO/RH)
ABO/RH(D): A NEG
Antibody Screen: NEGATIVE
Gestational Age(Wks): 8
Unit division: 0

## 2012-02-28 MED ORDER — CEFAZOLIN SODIUM-DEXTROSE 2-3 GM-% IV SOLR
2.0000 g | INTRAVENOUS | Status: DC
  Filled 2012-02-28: qty 50

## 2012-02-29 ENCOUNTER — Encounter (HOSPITAL_COMMUNITY): Admission: RE | Payer: Self-pay | Source: Ambulatory Visit

## 2012-02-29 ENCOUNTER — Ambulatory Visit (HOSPITAL_COMMUNITY): Admission: RE | Admit: 2012-02-29 | Payer: 59 | Source: Ambulatory Visit | Admitting: Obstetrics and Gynecology

## 2012-02-29 SURGERY — DILATION AND EVACUATION, UTERUS
Anesthesia: Choice

## 2013-07-13 ENCOUNTER — Other Ambulatory Visit: Payer: Self-pay

## 2014-06-22 ENCOUNTER — Other Ambulatory Visit: Payer: Self-pay

## 2014-07-09 ENCOUNTER — Encounter (HOSPITAL_COMMUNITY): Payer: Self-pay

## 2020-08-10 ENCOUNTER — Ambulatory Visit: Payer: 59 | Attending: Internal Medicine

## 2020-08-10 DIAGNOSIS — Z23 Encounter for immunization: Secondary | ICD-10-CM

## 2020-08-10 NOTE — Progress Notes (Signed)
   Covid-19 Vaccination Clinic  Name:  Lindsey Bowman    MRN: [id number] DOB: 1976/02/08  08/10/2020  Ms. Lindsey Bowman was observed post Covid-19 immunization for 15 minutes without incident. She was provided with Vaccine Information Sheet and instruction to access the V-Safe system.   Ms. Lindsey Bowman was instructed to call 911 with any severe reactions post vaccine: Marland Kitchen Difficulty breathing  . Swelling of face and throat  . A fast heartbeat  . A bad rash all over body  . Dizziness and weakness   Immunizations Administered    Name Date Dose VIS Date Route   Pfizer COVID-19 Vaccine 08/10/2020 12:27 PM 0.3 mL 06/26/2020 Intramuscular   Manufacturer: [employer], [employer]   Lot: [id number]   NDC: [address]-8

## 2021-01-20 ENCOUNTER — Emergency Department (HOSPITAL_BASED_OUTPATIENT_CLINIC_OR_DEPARTMENT_OTHER)
Admission: EM | Admit: 2021-01-20 | Discharge: 2021-01-20 | Disposition: A | Discharge status: Home / Self Care | Payer: 59 | Attending: Emergency Medicine | Admitting: Emergency Medicine

## 2021-01-20 ENCOUNTER — Emergency Department (HOSPITAL_BASED_OUTPATIENT_CLINIC_OR_DEPARTMENT_OTHER): Payer: 59 | Admitting: Radiology

## 2021-01-20 ENCOUNTER — Encounter (HOSPITAL_BASED_OUTPATIENT_CLINIC_OR_DEPARTMENT_OTHER): Payer: Self-pay

## 2021-01-20 ENCOUNTER — Other Ambulatory Visit: Payer: Self-pay

## 2021-01-20 DIAGNOSIS — Z87891 Personal history of nicotine dependence: Secondary | ICD-10-CM | POA: Diagnosis not present

## 2021-01-20 DIAGNOSIS — Y92410 Unspecified street and highway as the place of occurrence of the external cause: Secondary | ICD-10-CM | POA: Diagnosis not present

## 2021-01-20 DIAGNOSIS — M542 Cervicalgia: Secondary | ICD-10-CM | POA: Diagnosis not present

## 2021-01-20 DIAGNOSIS — M25511 Pain in right shoulder: Secondary | ICD-10-CM | POA: Diagnosis not present
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
## 2021-01-20 MED ORDER — CYCLOBENZAPRINE HCL 10 MG PO TABS: 10.0000 mg | ORAL_TABLET | Freq: Two times a day (BID) | ORAL | 0 refills | Status: DC | PRN
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
## 2021-03-24 ENCOUNTER — Other Ambulatory Visit: Payer: Self-pay | Admitting: Orthopedic Surgery

## 2021-03-24 DIAGNOSIS — M67911 Unspecified disorder of synovium and tendon, right shoulder: Secondary | ICD-10-CM

## 2021-03-30 ENCOUNTER — Ambulatory Visit
Admission: RE | Admit: 2021-03-30 | Discharge: 2021-03-30 | Disposition: A | Discharge status: Home / Self Care | Payer: 59 | Source: Ambulatory Visit | Attending: Orthopedic Surgery | Admitting: Orthopedic Surgery

## 2021-03-30 DIAGNOSIS — M67911 Unspecified disorder of synovium and tendon, right shoulder: Secondary | ICD-10-CM

## 2021-03-30 DIAGNOSIS — M67912 Unspecified disorder of synovium and tendon, left shoulder: Secondary | ICD-10-CM
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
## 2021-05-28 ENCOUNTER — Other Ambulatory Visit: Payer: Self-pay | Admitting: Orthopedic Surgery

## 2021-05-28 ENCOUNTER — Institutional Professional Consult (permissible substitution): Payer: 59 | Admitting: Pulmonary Disease

## 2021-05-28 DIAGNOSIS — M4722 Other spondylosis with radiculopathy, cervical region: Secondary | ICD-10-CM

## 2021-05-31 ENCOUNTER — Other Ambulatory Visit: Payer: Self-pay

## 2021-05-31 ENCOUNTER — Ambulatory Visit
Admission: RE | Admit: 2021-05-31 | Discharge: 2021-05-31 | Disposition: A | Discharge status: Home / Self Care | Payer: 59 | Source: Ambulatory Visit | Attending: Orthopedic Surgery | Admitting: Orthopedic Surgery

## 2021-05-31 DIAGNOSIS — M4722 Other spondylosis with radiculopathy, cervical region: Secondary | ICD-10-CM

## 2021-05-31 IMAGING — MR MR CERVICAL SPINE W/O CM
5 series · 29 of 48 positions shown · non-contrast
Comparison: None.

CLINICAL DATA: Cervical radiculopathy due to degenerative disease
of the spine. Neck and shoulder pain with numbness since [DATE].

EXAM:
MRI CERVICAL SPINE WITHOUT CONTRAST
TECHNIQUE: Multiplanar, multisequence MR imaging of the cervical spine was
performed. No intravenous contrast was administered.

[Series 7: T1 · sagittal · 3.0mm · 0.66mm/px · 6 of 16 slices shown]
[im 1/16]
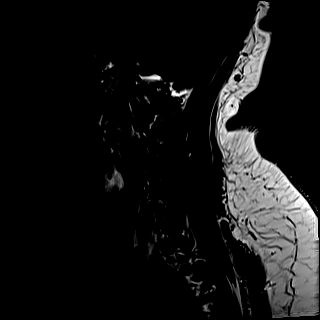
[im 4/16]
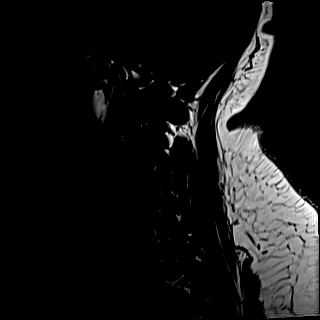
[im 7/16]
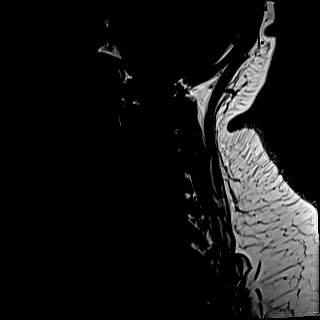
[im 10/16]
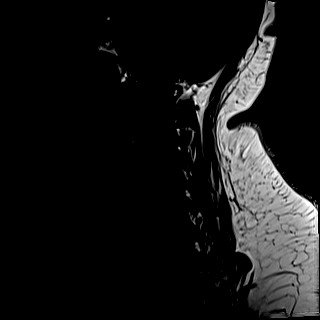
[im 13/16]
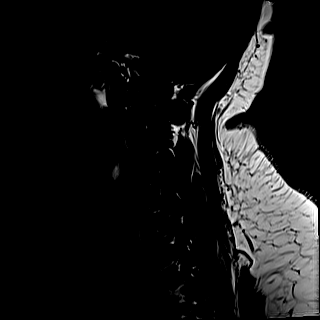
[im 16/16]
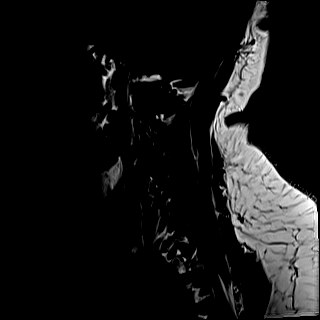

[Series 8: T2 · sagittal · 3.0mm · 0.55mm/px · 6 of 16 slices shown (1 of 2)]
[im 1/16]
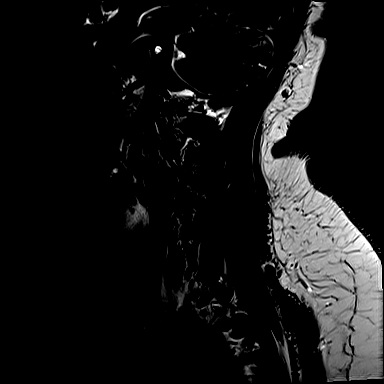
[im 4/16]
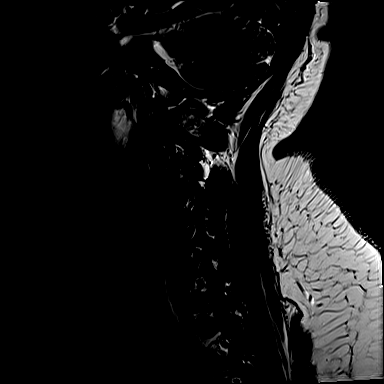
[im 7/16]
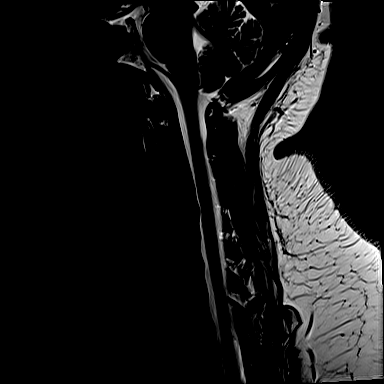
[im 10/16]
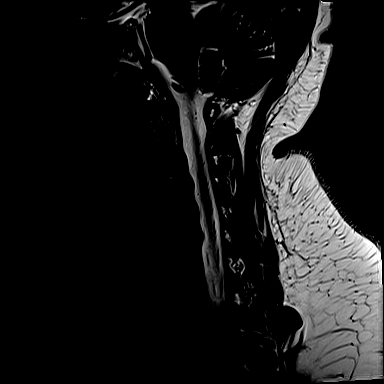
[im 13/16]
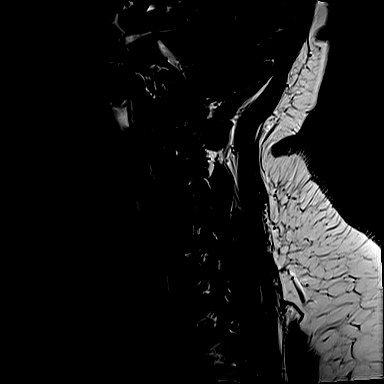
[im 16/16]
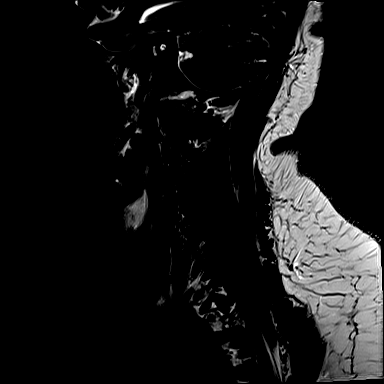

[Series 9: STIR · sagittal · 3.0mm · 0.33mm/px · 6 of 16 slices shown]
[im 1/16]
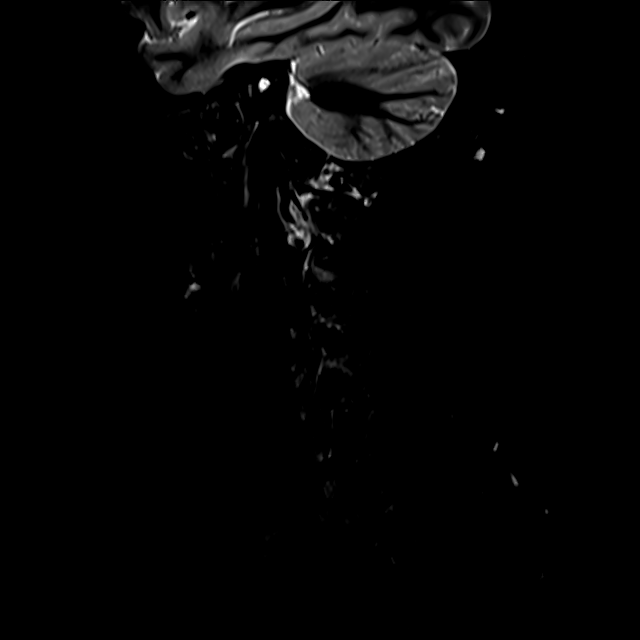
[im 4/16]
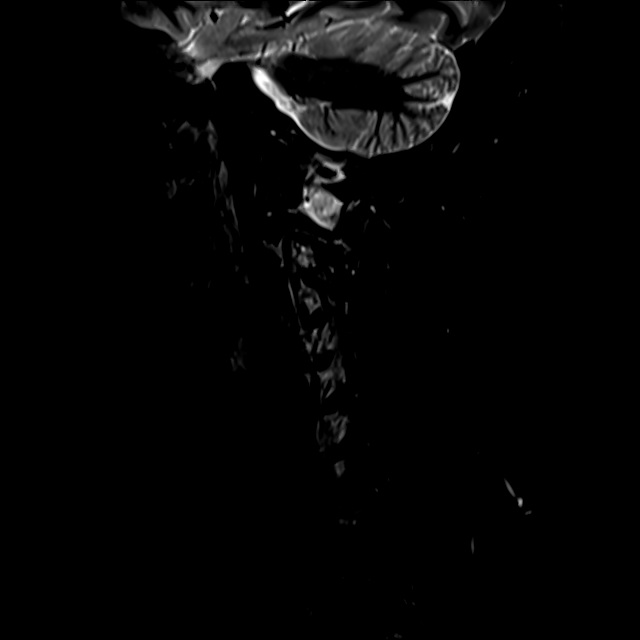
[im 7/16]
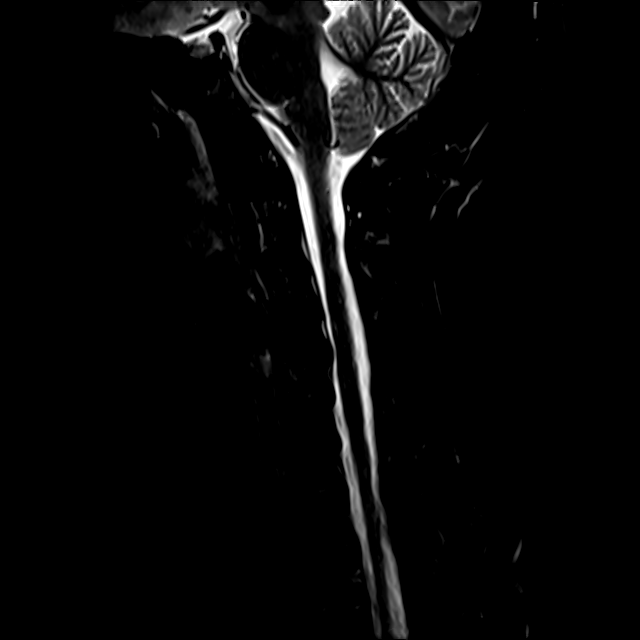
[im 10/16]
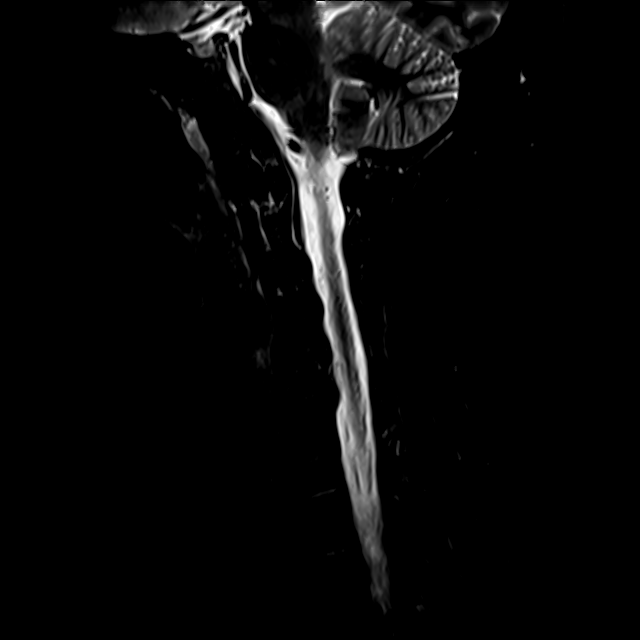
[im 13/16]
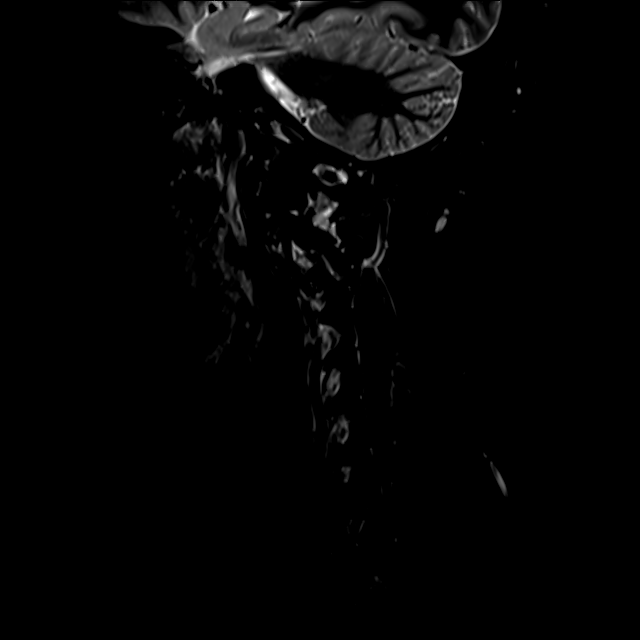
[im 16/16]
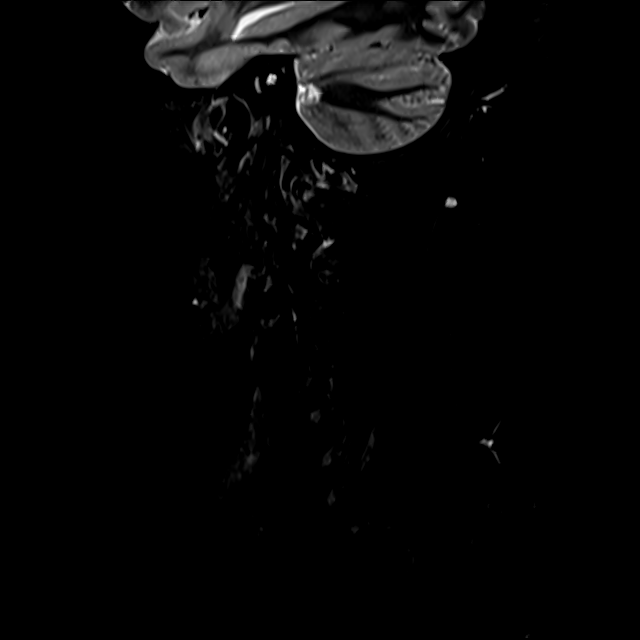

[Series 10: T2 · axial · 3.0mm · 0.50mm/px · z∈[-125,+14]mm · 9 of 43 slices shown (2 of 2)]
[im 1/43]
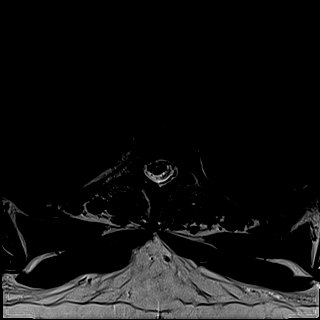
[im 7/43]
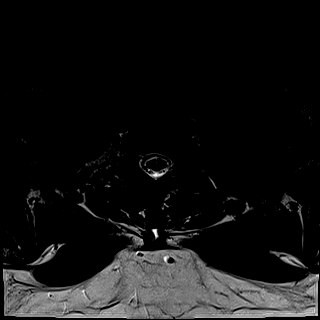
[im 13/43]
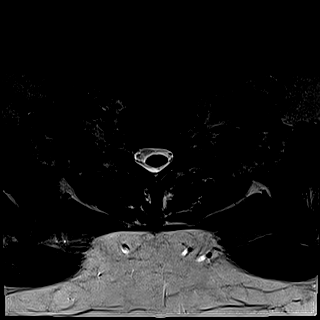
[im 19/43]
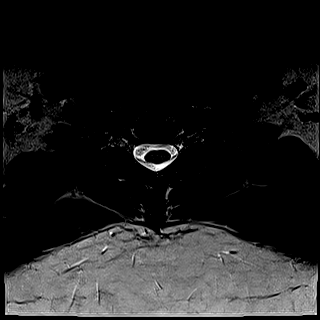
[im 22/43]
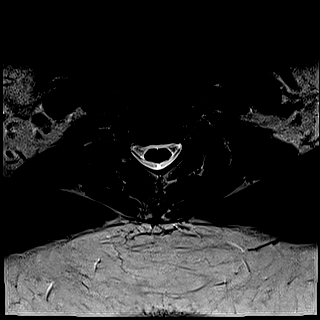
[im 25/43]
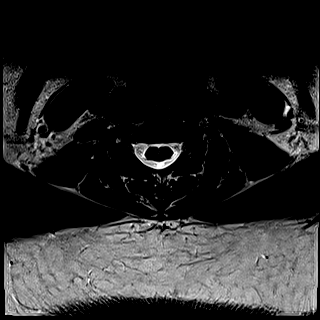
[im 31/43]
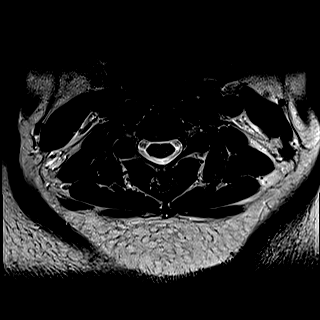
[im 37/43]
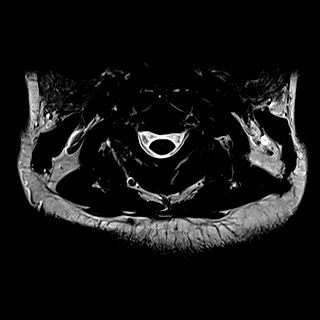
[im 43/43]
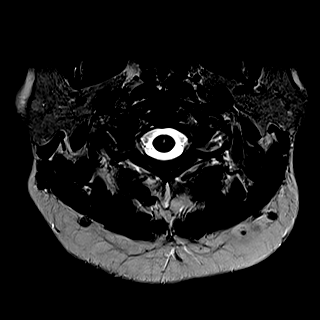

[Series 11: GRE · axial · 3.0mm · 0.42mm/px · z∈[-125,-105]mm · 2 of 43 slices shown]
[im 1/43]
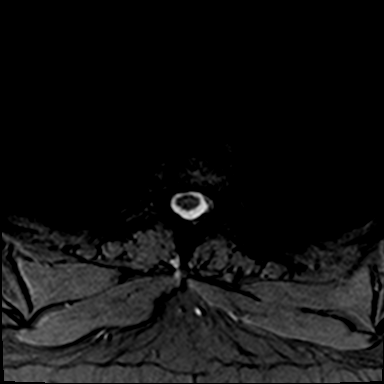
[im 7/43]
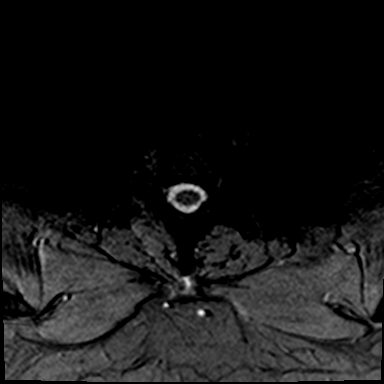

[29 of 48 positions shown; findings below may reference images not displayed]

FINDINGS: Alignment: Mild C2-3 anterolisthesis

Vertebrae: No fracture, evidence of discitis, or bone lesion.

Cord: Normal signal and morphology.

Posterior Fossa, vertebral arteries, paraspinal tissues: Negative.

Disc levels:

C2-3: Facet spurring asymmetric to the right with mild
anterolisthesis.

C3-4: Mild left facet spurring.  Small posterior annular fissure

C4-5: Small posterior annular fissure and minor disc bulging

C5-6: Tiny right paracentral protrusion

C6-7: Unremarkable.

C7-T1:Unremarkable.
IMPRESSION: Mild cervical spine degeneration without neural impingement or
visible inflammation.

## 2021-07-22 ENCOUNTER — Other Ambulatory Visit: Payer: Self-pay

## 2021-07-22 ENCOUNTER — Ambulatory Visit (INDEPENDENT_AMBULATORY_CARE_PROVIDER_SITE_OTHER): Payer: 59 | Admitting: Pulmonary Disease

## 2021-07-22 ENCOUNTER — Encounter: Payer: Self-pay | Admitting: Pulmonary Disease

## 2021-07-22 VITALS — BP 138/84 | HR 103 | Temp 98.3°F | Ht 70.0 in | Wt 360.4 lb

## 2021-07-22 DIAGNOSIS — G4733 Obstructive sleep apnea (adult) (pediatric): Secondary | ICD-10-CM

## 2021-07-22 DIAGNOSIS — R0683 Snoring: Secondary | ICD-10-CM

## 2021-07-22 DIAGNOSIS — Z6841 Body Mass Index (BMI) 40.0 and over, adult: Secondary | ICD-10-CM | POA: Diagnosis not present

## 2021-07-22 NOTE — Progress Notes (Signed)
Subjective:    Patient ID: Lindsey Bowman, female    DOB: [birth year], 45 y.o.   MRN: [id number]  HPI  45 year old morbidly obese woman presents for evaluation of sleep disordered breathing. 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  No bed partner history is available. She reports excessive daytime somnolence, Epworth sleepiness score is 6 but I feel like she is underreporting. Boyfriend has witnessed apneas.  Family members have reported loud snoring, she reports nonrefreshing sleep, feels like I have never slept at all, and wakes up with a sore throat in the morning.  She has difficulty concentrating at work. Bedtime is around 10:30 PM, sleep latency is 20 minutes, she sleeps on her back with 2 pillows, but reports multiple nocturnal awakenings about 3-4 times including nocturia and due to her arms feeling numb.  She is finally out of bed at 6:15 AM with tiredness dryness of mouth but denies headaches. She has gained 50 pounds in the last 2 years  Past Medical History:  Diagnosis Date   HSV-2 infection    No pertinent past medical history    Uterine polyp     Past surgical history bilateral shoulder surgery  No Known Allergies   Social History   Socioeconomic History   Marital status: Divorced    Spouse name: Not on file   Number of children: Not on file   Years of education: Not on file   Highest education level: Not on file  Occupational History   Not on file  Tobacco Use   Smoking status: Former    Types: Cigarettes    Quit date: 02/26/1998    Years since quitting: 23.4   Smokeless tobacco: Never  Vaping Use   Vaping Use: Never used  Substance and Sexual Activity   Alcohol use: Yes    Comment: no while pregnant   Drug use: No   Sexual activity: Yes  Other Topics Concern   Not on file  Social History Narrative   Not on file   Social Determinants of Health   Financial Resource Strain: Not on file  Food Insecurity: Not  on file  Transportation Needs: Not on file  Physical Activity: Not on file  Stress: Not on file  Social Connections: Not on file  Intimate Partner Violence: Not on file   FH -dad and mom both have OSA  Review of Systems  shortness of breath with activity Weight gain Anxiety Feet swelling  Constitutional: negative for anorexia, fevers and sweats  Eyes: negative for irritation, redness and visual disturbance  Ears, nose, mouth, throat, and face: negative for earaches, epistaxis, nasal congestion and sore throat  Respiratory: negative for cough, dyspnea on exertion, sputum and wheezing  Cardiovascular: negative for chest pain, dyspnea, orthopnea, palpitations and syncope  Gastrointestinal: negative for abdominal pain, constipation, diarrhea, melena, nausea and vomiting  Genitourinary:negative for dysuria, frequency and hematuria  Hematologic/lymphatic: negative for bleeding, easy bruising and lymphadenopathy  Musculoskeletal:negative for arthralgias, muscle weakness and stiff joints  Neurological: negative for coordination problems, gait problems, headaches and weakness  Endocrine: negative for diabetic symptoms including polydipsia, polyuria and weight loss     Objective:   Physical Exam  Gen. Pleasant, obese, in no distress, normal affect ENT - no pallor,icterus, no post nasal drip, class 2 airway Neck: No JVD, no thyromegaly, no carotid bruits Lungs: no use of accessory muscles, no dullness to percussion, decreased without rales or rhonchi  Cardiovascular: Rhythm regular, heart sounds  normal, no murmurs or gallops, no peripheral edema Abdomen: soft and non-tender, no hepatosplenomegaly, BS normal. Musculoskeletal: No deformities, no cyanosis or clubbing Neuro:  alert, non focal, no tremors       Assessment & Plan:

## 2021-07-22 NOTE — Assessment & Plan Note (Signed)
Given excessive daytime somnolence, narrow pharyngeal exam, witnessed apneas & loud snoring, obstructive sleep apnea is very likely & an overnight polysomnogram will be scheduled as a home study. The pathophysiology of obstructive sleep apnea , it's cardiovascular consequences & modes of treatment including CPAP were discused with the patient in detail & they evidenced understanding.  Pretest probably  is high, we will likely put her on auto CPAP based on home sleep test results but she may also need an attended titration to ensure that she does not need BiPAP for hypoventilation

## 2021-07-22 NOTE — Assessment & Plan Note (Signed)
Weight loss increase is the most important intervention here.  Close association between OSA and morbid obesity was discussed

## 2021-07-22 NOTE — Patient Instructions (Signed)
Home sleep test You may need a CPAP machine

## 2021-09-19 ENCOUNTER — Telehealth: Payer: Self-pay | Admitting: Pulmonary Disease

## 2021-09-19 NOTE — Telephone Encounter (Signed)
Looks like pt was returning a call about HST.  Routing to PCCs.

## 2021-09-23 NOTE — Telephone Encounter (Signed)
Spoke to pt and sched hst nothing further needed.

## 2021-09-29 ENCOUNTER — Ambulatory Visit (INDEPENDENT_AMBULATORY_CARE_PROVIDER_SITE_OTHER): Payer: 59

## 2021-09-29 ENCOUNTER — Other Ambulatory Visit: Payer: Self-pay

## 2021-09-29 DIAGNOSIS — R0683 Snoring: Secondary | ICD-10-CM

## 2021-09-29 DIAGNOSIS — G4733 Obstructive sleep apnea (adult) (pediatric): Secondary | ICD-10-CM | POA: Diagnosis not present

## 2021-10-01 ENCOUNTER — Telehealth: Payer: Self-pay | Admitting: Pulmonary Disease

## 2021-10-01 DIAGNOSIS — G4733 Obstructive sleep apnea (adult) (pediatric): Secondary | ICD-10-CM | POA: Diagnosis not present

## 2021-10-01 NOTE — Telephone Encounter (Signed)
HST showed mild  OSA with AHI 7/ hr Suggest autoCPAP  5-15 cm, mask of choice OV with me/APP in 6 wks after starting

## 2021-10-06 NOTE — Telephone Encounter (Signed)
ATC patient to go over results, LMTCB °

## 2021-10-06 NOTE — Telephone Encounter (Signed)
Spoke with patient to go over HST results. She would like to proceed with CPAP set up. Order has been placed and she has been advised to call us back once she physically get the machine to schedule an appointment 31-90 days after getting it. Nothing further needed at this time.

## 2021-12-30 ENCOUNTER — Encounter: Payer: Self-pay | Admitting: Pulmonary Disease

## 2021-12-30 ENCOUNTER — Ambulatory Visit (INDEPENDENT_AMBULATORY_CARE_PROVIDER_SITE_OTHER): Payer: 59 | Admitting: Pulmonary Disease

## 2021-12-30 VITALS — BP 106/74 | HR 92 | Temp 98.3°F | Ht 70.0 in | Wt 343.6 lb

## 2021-12-30 DIAGNOSIS — G4733 Obstructive sleep apnea (adult) (pediatric): Secondary | ICD-10-CM | POA: Diagnosis not present

## 2021-12-30 DIAGNOSIS — Z6841 Body Mass Index (BMI) 40.0 and over, adult: Secondary | ICD-10-CM | POA: Diagnosis not present

## 2021-12-30 NOTE — Assessment & Plan Note (Signed)
She has lost 17 pounds over the last few months ?Further weight loss was increased and close correlation between weight and OSA was discussed ?

## 2021-12-30 NOTE — Patient Instructions (Signed)
?  CPAP is working well ?X Change CPAP pressure 5-12 cm ? ? ?

## 2021-12-30 NOTE — Progress Notes (Signed)
? ?  Subjective:  ? ? Patient ID: Lindsey Bowman, female    DOB: Aug 27, 1976, 46 y.o.   MRN: [id number] ? ?HPI ? ?46 year old morbidly obese woman presented with excessive daytime fatigue and witnessed apneas and weight gain. ?She had difficulty concentrating at work.  And loud snoring was noted by family members.  Home sleep test showed mild OSA by AHI criteria but moderate desaturation.  We put her on CPAP therapy in 2 days and follow-up visit.  She has settled down with nasal pillows she reports increased energy and mental clarity.  She is resting better, denies naps during the daytime ?Pressure is okay and she denies dryness ?She has lost 17 pounds since her last visit 5 months ago ? ?Significant tests/ events reviewed ? ?09/2021 HST showed mild  OSA with AHI 7/ hr, lowest desat 80%, wt 360 lbs, Study time 374 min ? ?Review of Systems ?neg for any significant sore throat, dysphagia, itching, sneezing, nasal congestion or excess/ purulent secretions, fever, chills, sweats, unintended wt loss, pleuritic or exertional cp, hempoptysis, orthopnea pnd or change in chronic leg swelling. Also denies presyncope, palpitations, heartburn, abdominal pain, nausea, vomiting, diarrhea or change in bowel or urinary habits, dysuria,hematuria, rash, arthralgias, visual complaints, headache, numbness weakness or ataxia. ? ?   ?Objective:  ? Physical Exam ? ?Gen. Pleasant, obese, in no distress ?ENT - no lesions, no post nasal drip ?Neck: No JVD, no thyromegaly, no carotid bruits ?Lungs: no use of accessory muscles, no dullness to percussion, decreased without rales or rhonchi  ?Cardiovascular: Rhythm regular, heart sounds  normal, no murmurs or gallops, no peripheral edema ?Musculoskeletal: No deformities, no cyanosis or clubbing , no tremors ? ? ? ?   ?Assessment & Plan:  ? ? ?

## 2021-12-30 NOTE — Assessment & Plan Note (Signed)
CPAP download was reviewed which shows excellent control of events on auto CPAP settings with maximum pressure of 10 cm and average pressure of 8.6 cm, minimal leak.  She has excellent compliance more than 7.5 hours per night on average.  Overall she has achieved significant benefits from CPAP even in this short duration. ? ?Weight loss encouraged, compliance with goal of at least 4-6 hrs every night is the expectation. ?Advised against medications with sedative side effects ?Cautioned against driving when sleepy - understanding that sleepiness will vary on a day to day basis ? ?

## 2022-04-10 ENCOUNTER — Telehealth: Payer: 59 | Admitting: Physician Assistant

## 2022-04-10 DIAGNOSIS — R42 Dizziness and giddiness: Secondary | ICD-10-CM | POA: Diagnosis not present

## 2022-04-10 MED ORDER — MECLIZINE HCL 25 MG PO TABS: 25.0000 mg | ORAL_TABLET | Freq: Three times a day (TID) | ORAL | 0 refills | Status: DC | PRN

## 2022-04-10 NOTE — Patient Instructions (Signed)
Lindsey Bowman, thank you for joining Lindsey Loveless, PA-C for today's virtual visit.  While this provider is not your primary care provider (PCP), if your PCP is located in our provider database this encounter information will be shared with them immediately following your visit.  Consent: (Patient) Lindsey Bowman provided verbal consent for this virtual visit at the beginning of the encounter.  Current Medications:  Current Outpatient Medications:    meclizine (ANTIVERT) 25 MG tablet, Take 1 tablet (25 mg total) by mouth 3 (three) times daily as needed for dizziness., Disp: 30 tablet, Rfl: 0   celecoxib (CELEBREX) 100 MG capsule, Take 100 mg by mouth 2 (two) times daily., Disp: , Rfl:    sertraline (ZOLOFT) 50 MG tablet, Take by mouth., Disp: , Rfl:    Medications ordered in this encounter:  Meds ordered this encounter  Medications   meclizine (ANTIVERT) 25 MG tablet    Sig: Take 1 tablet (25 mg total) by mouth 3 (three) times daily as needed for dizziness.    Dispense:  30 tablet    Refill:  0    Order Specific Question:   Supervising Provider    Answer:   Hyacinth Meeker, BRIAN [[id number]]     *If you need refills on other medications prior to your next appointment, please contact your pharmacy*  Follow-Up: Call back or seek an in-person evaluation if the symptoms worsen or if the condition fails to improve as anticipated.  Other Instructions  Orthostatic Hypotension Blood pressure is a measurement of how strongly, or weakly, your circulating blood is pressing against the walls of your arteries. Orthostatic hypotension is a drop in blood pressure that can happen when you change positions, such as when you go from lying down to standing. Arteries are blood vessels that carry blood from your heart throughout your body. When blood pressure is too low, you may not get enough blood to your brain or to the rest of your organs. Orthostatic hypotension can cause light-headedness, sweating,  rapid heartbeat, blurred vision, and fainting. These symptoms require further investigation into the cause. What are the causes? Orthostatic hypotension can be caused by many things, including: Sudden changes in posture, such as standing up quickly after you have been sitting or lying down. Loss of blood (anemia) or loss of body fluids (dehydration). Heart problems, neurologic problems, or hormone problems. Pregnancy. Aging. The risk for this condition increases as you get older. Severe infection (sepsis). Certain medicines, such as medicines for high blood pressure or medicines that make the body lose excess fluids (diuretics). What are the signs or symptoms? Symptoms of this condition may include: Weakness, light-headedness, or dizziness. Sweating. Blurred vision. Tiredness (fatigue). Rapid heartbeat. Fainting, in severe cases. How is this diagnosed? This condition is diagnosed based on: Your symptoms and medical history. Your blood pressure measurements. Your health care provider will check your blood pressure when you are: Lying down. Sitting. Standing. A blood pressure reading is recorded as two numbers, such as "120 over 80" (or 120/80). The first ("top") number is called the systolic pressure. It is a measure of the pressure in your arteries as your heart beats. The second ("bottom") number is called the diastolic pressure. It is a measure of the pressure in your arteries when your heart relaxes between beats. Blood pressure is measured in a unit called mmHg. Healthy blood pressure for most adults is 120/80 mmHg. Orthostatic hypotension is defined as a 20 mmHg drop in systolic pressure or a 10 mmHg  drop in diastolic pressure within 3 minutes of standing. Other information or tests that may be used to diagnose orthostatic hypotension include: Your other vital signs, such as your heart rate and temperature. Blood tests. An electrocardiogram (ECG) or echocardiogram. A Holter  monitor. This is a device you wear that records your heart rhythm continuously, usually for 24-48 hours. Tilt table test. For this test, you will be safely secured to a table that moves you from a lying position to an upright position. Your heart rhythm and blood pressure will be monitored during the test. How is this treated? This condition may be treated by: Changing your diet. This may involve eating more salt (sodium) or drinking more water. Changing the dosage of certain medicines you are taking that might be lowering your blood pressure. Correcting the underlying reason for the orthostatic hypotension. Wearing compression stockings. Taking medicines to raise your blood pressure. Avoiding actions that trigger symptoms. Follow these instructions at home: Medicines Take over-the-counter and prescription medicines only as told by your health care provider. Follow instructions from your health care provider about changing the dosage of your current medicines, if this applies. Do not stop or adjust any of your medicines on your own. Eating and drinking  Drink enough fluid to keep your urine pale yellow. Eat extra salt only as directed. Do not add extra salt to your diet unless advised by your health care provider. Eat frequent, small meals. Avoid standing up suddenly after eating. General instructions  Get up slowly from lying down or sitting positions. This gives your blood pressure a chance to adjust. Avoid hot showers and excessive heat as directed by your health care provider. Engage in regular physical activity as directed by your health care provider. If you have compression stockings, wear them as told. Keep all follow-up visits. This is important. Contact a health care provider if: You have a fever for more than 2-3 days. You feel more thirsty than usual. You feel dizzy or weak. Get help right away if: You have chest pain. You have a fast or irregular heartbeat. You become  sweaty or feel light-headed. You feel short of breath. You faint. You have any symptoms of a stroke. "BE FAST" is an easy way to remember the main warning signs of a stroke: B - Balance. Signs are dizziness, sudden trouble walking, or loss of balance. E - Eyes. Signs are trouble seeing or a sudden change in vision. F - Face. Signs are sudden weakness or numbness of the face, or the face or eyelid drooping on one side. A - Arms. Signs are weakness or numbness in an arm. This happens suddenly and usually on one side of the body. S - Speech. Signs are sudden trouble speaking, slurred speech, or trouble understanding what people say. T - Time. Time to call emergency services. Write down what time symptoms started. You have other signs of a stroke, such as: A sudden, severe headache with no known cause. Nausea or vomiting. Seizure. These symptoms may represent a serious problem that is an emergency. Do not wait to see if the symptoms will go away. Get medical help right away. Call your local emergency services (911 in the [place] Do not drive yourself to the hospital. Summary Orthostatic hypotension is a sudden drop in blood pressure. It can cause light-headedness, sweating, rapid heartbeat, blurred vision, and fainting. Orthostatic hypotension can be diagnosed by having your blood pressure taken while lying down, sitting, and then standing. Treatment may involve changing your  diet, wearing compression stockings, sitting up slowly, adjusting your medicines, or correcting the underlying reason for the orthostatic hypotension. Get help right away if you have chest pain, a fast or irregular heartbeat, or symptoms of a stroke. This information is not intended to replace advice given to you by your health care provider. Make sure you discuss any questions you have with your health care provider. Document Revised: 11/07/2020 Document Reviewed: 11/07/2020 Elsevier Patient Education  2023 Else[employer]  Inc.   Benign Positional Vertigo Vertigo is the feeling that you or your surroundings are moving when they are not. Benign positional vertigo is the most common form of vertigo. This is usually a harmless condition (benign). This condition is positional. This means that symptoms are triggered by certain movements and positions. This condition can be dangerous if it occurs while you are doing something that could cause harm to yourself or others. This includes activities such as driving or operating machinery. What are the causes? The inner ear has fluid-filled canals that help your brain sense movement and balance. When the fluid moves, the brain receives messages about your body's position. With benign positional vertigo, calcium crystals in the inner ear break free and disturb the inner ear area. This causes your brain to receive confusing messages about your body's position. What increases the risk? You are more likely to develop this condition if: You are a woman. You are 83 years of age or older. You have recently had a head injury. You have an inner ear disease. What are the signs or symptoms? Symptoms of this condition usually happen when you move your head or your eyes in different directions. Symptoms may start suddenly and usually last for less than a minute. They include: Loss of balance and falling. Feeling like you are spinning or moving. Feeling like your surroundings are spinning or moving. Nausea and vomiting. Blurred vision. Dizziness. Involuntary eye movement (nystagmus). Symptoms can be mild and cause only minor problems, or they can be severe and interfere with daily life. Episodes of benign positional vertigo may return (recur) over time. Symptoms may also improve over time. How is this diagnosed? This condition may be diagnosed based on: Your medical history. A physical exam of the head, neck, and ears. Positional tests to check for or stimulate vertigo. You may be  asked to turn your head and change positions, such as going from sitting to lying down. A health care provider will watch for symptoms of vertigo. You may be referred to a health care provider who specializes in ear, nose, and throat problems (ENT or otolaryngologist) or a provider who specializes in disorders of the nervous system (neurologist). How is this treated?  This condition may be treated in a session in which your health care provider moves your head in specific positions to help the displaced crystals in your inner ear move. Treatment for this condition may take several sessions. Surgery may be needed in severe cases, but this is rare. In some cases, benign positional vertigo may resolve on its own in 2-4 weeks. Follow these instructions at home: Safety Move slowly. Avoid sudden body or head movements or certain positions, as told by your health care provider. Avoid driving or operating machinery until your health care provider says it is safe. Avoid doing any tasks that would be dangerous to you or others if vertigo occurs. If you have trouble walking or keeping your balance, try using a cane for stability. If you feel dizzy or unstable, sit down  right away. Return to your normal activities as told by your health care provider. Ask your health care provider what activities are safe for you. General instructions Take over-the-counter and prescription medicines only as told by your health care provider. Drink enough fluid to keep your urine pale yellow. Keep all follow-up visits. This is important. Contact a health care provider if: You have a fever. Your condition gets worse or you develop new symptoms. Your family or friends notice any behavioral changes. You have nausea or vomiting that gets worse. You have numbness or a prickling and tingling sensation. Get help right away if you: Have difficulty speaking or moving. Are always dizzy or faint. Develop severe headaches. Have  weakness in your legs or arms. Have changes in your hearing or vision. Develop a stiff neck. Develop sensitivity to light. These symptoms may represent a serious problem that is an emergency. Do not wait to see if the symptoms will go away. Get medical help right away. Call your local emergency services (911 in the [place] Do not drive yourself to the hospital. Summary Vertigo is the feeling that you or your surroundings are moving when they are not. Benign positional vertigo is the most common form of vertigo. This condition is caused by calcium crystals in the inner ear that become displaced. This causes a disturbance in an area of the inner ear that helps your brain sense movement and balance. Symptoms include loss of balance and falling, feeling that you or your surroundings are moving, nausea and vomiting, and blurred vision. This condition can be diagnosed based on symptoms, a physical exam, and positional tests. Follow safety instructions as told by your health care provider and keep all follow-up visits. This is important. This information is not intended to replace advice given to you by your health care provider. Make sure you discuss any questions you have with your health care provider. Document Revised: 07/24/2020 Document Reviewed: 07/24/2020 Elsevier Patient Education  2023 Elsevier Inc.    If you have been instructed to have an in-person evaluation today at a local Urgent Care facility, please use the link below. It will take you to a list of all of our available [facility], including address, phone number and hours of operation. Please do not delay care.  [facility]  If you or a family member do not have a primary care provider, use the link below to schedule a visit and establish care. When you choose a [facility] primary care physician or advanced practice provider, you gain a long-term partner in health. Find a Primary Care Provider  Learn more  about [facility]'s in-office and virtual care options: [facility] - Get Care Now

## 2022-04-10 NOTE — Progress Notes (Signed)
Virtual Visit Consent   Lindsey Bowman, you are scheduled for a virtual visit with a [facility] provider today. Just as with appointments in the office, your consent must be obtained to participate. Your consent will be active for this visit and any virtual visit you may have with one of our providers in the next 365 days. If you have a MyChart account, a copy of this consent can be sent to you electronically.  As this is a virtual visit, video technology does not allow for your provider to perform a traditional examination. This may limit your provider's ability to fully assess your condition. If your provider identifies any concerns that need to be evaluated in person or the need to arrange testing (such as labs, EKG, etc.), we will make arrangements to do so. Although advances in technology are sophisticated, we cannot ensure that it will always work on either your end or our end. If the connection with a video visit is poor, the visit may have to be switched to a telephone visit. With either a video or telephone visit, we are not always able to ensure that we have a secure connection.  By engaging in this virtual visit, you consent to the provision of healthcare and authorize for your insurance to be billed (if applicable) for the services provided during this visit. Depending on your insurance coverage, you may receive a charge related to this service.  I need to obtain your verbal consent now. Are you willing to proceed with your visit today? [name] has provided verbal consent on 04/10/2022 for a virtual visit (video or telephone). [name], PA-C  Date: 04/10/2022 2:21 PM  Virtual Visit via Video Note   I, [name], connected with  Lindsey Bowman  ([id number], 1976/02/18) on 04/10/22 at  1:00 PM EDT by a video-enabled telemedicine application and verified that I am speaking with the correct person using two identifiers.  Location: Patient: Virtual Visit  Location Patient: Mobile Provider: Virtual Visit Location Provider: Home Office   I discussed the limitations of evaluation and management by telemedicine and the availability of in person appointments. The patient expressed understanding and agreed to proceed.    History of Present Illness: Lindsey Bowman is a 46 y.o. who identifies as a female who was assigned female at birth, and is being seen today for dizziness.  HPI: Dizziness This is a new problem. The current episode started more than 1 month ago (3 months). The problem occurs intermittently. The problem has been gradually worsening. Associated symptoms include vertigo (episodes last only a few seconds) and a visual change (had spots in visual field after closing eyes). Pertinent negatives include no anorexia, chills, congestion, diaphoresis, fever or nausea. Associated symptoms comments: Loss of balance. Exacerbated by: no known trigger; has noticed with from sitting to standing, standing prolonged periods of time, just sitting. She has tried rest and sleep for the symptoms. The treatment provided no relief.    Problems:  Patient Active Problem List   Diagnosis Date Noted   OSA (obstructive sleep apnea) 07/22/2021   NECK PAIN 07/30/2008   DISTURBANCE OF SKIN SENSATION 07/30/2008   URI 06/18/2008   ONYCHOMYCOSIS, TOENAILS 09/02/2007   OBESITY 09/02/2007   ANEMIA-NOS 09/02/2007   MENORRHAGIA 09/02/2007    Allergies: No Known Allergies Medications:  Current Outpatient Medications:    meclizine (ANTIVERT) 25 MG tablet, Take 1 tablet (25 mg total) by mouth 3 (three) times daily as needed for dizziness.,  Disp: 30 tablet, Rfl: 0   celecoxib (CELEBREX) 100 MG capsule, Take 100 mg by mouth 2 (two) times daily., Disp: , Rfl:    sertraline (ZOLOFT) 50 MG tablet, Take by mouth., Disp: , Rfl:   Observations/Objective: Patient is well-developed, well-nourished in no acute distress.  Resting comfortably  Head is normocephalic,  atraumatic.  No labored breathing.  Speech is clear and coherent with logical content.  Patient is alert and oriented at baseline.    Assessment and Plan: 1. Dizziness - meclizine (ANTIVERT) 25 MG tablet; Take 1 tablet (25 mg total) by mouth 3 (three) times daily as needed for dizziness.  Dispense: 30 tablet; Refill: 0  - Unknown trigger; discussed BPPV, orthostatic hypotension, vertiginous migraines, Meniere's - Will give meclizine for severe cases - Discussed increasing fluids, add compression stockings - Discussed establishing with a PCP locally for more thorough evaluation - Seek in person evaluation if symptoms worsen before she can establish care  Follow Up Instructions: I discussed the assessment and treatment plan with the patient. The patient was provided an opportunity to ask questions and all were answered. The patient agreed with the plan and demonstrated an understanding of the instructions.  A copy of instructions were sent to the patient via MyChart unless otherwise noted below.    The patient was advised to call back or seek an in-person evaluation if the symptoms worsen or if the condition fails to improve as anticipated.  Time:  I spent 25 minutes with the patient via telehealth technology discussing the above problems/concerns.    [name], PA-C

## 2022-04-20 ENCOUNTER — Telehealth: Payer: 59 | Admitting: Family Medicine

## 2022-04-20 ENCOUNTER — Encounter: Payer: Self-pay | Admitting: Physician Assistant

## 2022-04-20 ENCOUNTER — Encounter: Payer: Self-pay | Admitting: Family Medicine

## 2022-04-20 DIAGNOSIS — R42 Dizziness and giddiness: Secondary | ICD-10-CM

## 2022-04-20 NOTE — Progress Notes (Signed)
E Visit for Motion Sickness  We are sorry that you are not feeling well. Here is how we plan to help!  Based on what you have shared with me it looks like you have symptoms of motion sickness.   Meclizine 25mg  by mouth three times per day as needed for nausea/motion sickness Use the prescription provided at last visit.   Prevention:  You might feel better if you keep your eyes focused on outside while you are in motion. For example, if you are in a car, sit in the front and look in the direction you are moving; if you are on a boat, stay on the deck and look to the horizon. This helps make what you see match the movement you are feeling, and so you are less likely to feel sick.  You should also avoid reading, watching a movie, texting or reading messages, or looking at things close to you inside the vehicle you are riding in.  Use the seat head rest. Lean your head against the back of the seat or head rest when traveling in vehicles with seats to minimize head movements.  On a ship: When making your reservations, choose a cabin in the middle of the ship and near the waterline. When on board, go up on deck and focus on the horizon.  In an airplane: Request a window seat and look out the window. A seat over the front edge of the wing is the most preferable spot (the degree of motion is the lowest here). Direct the air vent to blow cool air on your face.  On a train: Always face forward and sit near a window.  In a vehicle: Sit in the front seat; if you are the passenger, look at the scenery in the distance. For some people, driving the vehicle (rather than being a passenger) is an instant remedy.  Avoid others who have become nauseous with motion sickness. Seeing and smelling others who have motion sickness may cause you to become sick.  GET HELP RIGHT AWAY IF:  Your symptoms do not improve or worsen within 2 days after treatment.  You cannot keep down fluids after trying the  medication.  Other associated symptoms such as severe headache, visual field changes, fever, or intractable nausea and vomiting.  MAKE SURE YOU:  Understand these instructions. Will watch your condition. Will get help right away if you are not doing well or get worse.  Thank you for choosing an e-visit.  Your e-visit answers were reviewed by a board certified advanced clinical practitioner to complete your personal care plan. Depending upon the condition, your plan could have included both over the counter or prescription medications.  Please review your pharmacy choice. Be sure that the pharmacy you have chosen is open so that you can pick up your prescription now.  If there is a problem you may message your provider in MyChart to have the prescription routed to another pharmacy.  Your safety is important to . If you have drug allergies check your prescription carefully.   For the next 24 hours, you can use MyChart to ask questions about today's visit, request a non-urgent call back, or ask for a work or school excuse from your e-visit provider.  You will get an e-mail in the next two days asking about your experience. I hope that your e-visit has been valuable and will speed your recovery.   References or for more information: [place] [email or web address].https://cross.com/ https://www.uptodate.com  I provided 5 minutes of  non face-to-face time during this encounter for chart review, medication and order placement, as well as and documentation.

## 2022-04-21 ENCOUNTER — Encounter: Payer: Self-pay | Admitting: Physician Assistant

## 2022-07-01 ENCOUNTER — Ambulatory Visit: Payer: 59 | Admitting: Nurse Practitioner

## 2022-07-01 ENCOUNTER — Encounter: Payer: Self-pay | Admitting: Nurse Practitioner

## 2022-07-01 ENCOUNTER — Ambulatory Visit (INDEPENDENT_AMBULATORY_CARE_PROVIDER_SITE_OTHER): Payer: 59 | Admitting: Nurse Practitioner

## 2022-07-01 DIAGNOSIS — G4733 Obstructive sleep apnea (adult) (pediatric): Secondary | ICD-10-CM

## 2022-07-01 NOTE — Patient Instructions (Addendum)
Increase CPAP usage to every night, minimum of 4-6 hours a night.  Change equipment every 30 days or as directed by DME. Wash your tubing with warm soap and water daily, hang to dry. Wash humidifier portion weekly.  Be aware of reduced alertness and do not drive or operate heavy machinery if experiencing this or drowsiness.  Exercise encouraged, as tolerated. Avoid or decrease alcohol consumption and medications that make you more sleepy, if possible. Notify if persistent daytime sleepiness occurs even with consistent use of CPAP.  Aim for 7-8 hours of sleep a night  Good luck with your new business!  Follow up in 6 months with Dr. Elsworth Soho or sooner if needed

## 2022-07-01 NOTE — Progress Notes (Signed)
$'@Patient'M$  ID: Lindsey Bowman, female    DOB: 12-24-1975, 46 y.o.   MRN: [id number]  Chief Complaint  Patient presents with   Follow-up    Pt f/u, she feels like the machine is working well other than a little dryness at night and the occasional water going in her nose. She does report feeling better when she does get to use it    Referring provider: No ref. provider found  HPI: 46 year old female, former smoker followed for OSA on CPAP. She is a patient of Dr. Bari Mantis and last seen in office on 12/30/2021. Past medical history significant for obesity.  TEST/EVENTS:  09/2021 HST: AHI 7/h, SpO2 low 80%  07/01/2022: Today - follow up Patient presents today for 6 month follow up. She has been doing well since we saw her last. Struggled with some vertigo and was evaluated by ENT in September. Overall, this has improved. 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 She's also still working her day job. She says she maybe gets 6 hours a night right now. She knows she needs more than this and hopes once things are off the ground, this will get better. Otherwise, no concerns or complaints. Wears her CPAP most nights. Sometimes falls asleep on the cough without it. Denies any morning headaches, drowsy driving, sleep parasomnias/paralysis.   05/31/2022-06/29/2022 CPAP 5-12 cmH2O 27/30 days; 63% > 4 hr; av usage 5 hours 17 min Pressure median 6.2, 95th 8.6 Leaks median 0.2, 95th 5.9 AHI 0.2  No Known Allergies  Immunization History  Administered Date(s) Administered   PFIZER(Purple Top)SARS-COV-2 Vaccination 08/10/2020   Rho (D) Immune Globulin 02/27/2012   Tdap 04/07/2005    Past Medical History:  Diagnosis Date   HSV-2 infection    No pertinent past medical history    Uterine polyp     Tobacco History: Social History   Tobacco Use  Smoking Status Former   Types: Cigarettes   Quit date: 02/26/1998   Years since  quitting: 24.3  Smokeless Tobacco Never   Counseling given: Not Answered   Outpatient Medications Prior to Visit  Medication Sig Dispense Refill   celecoxib (CELEBREX) 100 MG capsule Take 100 mg by mouth 2 (two) times daily. (Patient not taking: Reported on 07/01/2022)     meclizine (ANTIVERT) 25 MG tablet Take 1 tablet (25 mg total) by mouth 3 (three) times daily as needed for dizziness. (Patient not taking: Reported on 07/01/2022) 30 tablet 0   sertraline (ZOLOFT) 50 MG tablet Take by mouth. (Patient not taking: Reported on 07/01/2022)     No facility-administered medications prior to visit.     Review of Systems:   Constitutional: No weight loss or gain, night sweats, fevers, chills, or lassitude. +fatigue  HEENT: No headaches, difficulty swallowing, tooth/dental problems, or sore throat. No sneezing, itching, ear ache, nasal congestion, or post nasal drip CV:  No chest pain, orthopnea, PND, swelling in lower extremities, anasarca, dizziness, palpitations, syncope Resp: No shortness of breath with exertion or at rest. No excess mucus or change in color of mucus. No productive or non-productive. No hemoptysis. No wheezing.  No chest wall deformity GI:  No heartburn, indigestion MSK:  No joint pain or swelling.  No decreased range of motion.  No back pain. Neuro: No dizziness or lightheadedness.  Psych: No depression or anxiety. Mood stable.     Physical Exam:  BP 128/78   Pulse  87   Ht $R'5\' 10"'EM$  (1.778 m)   Wt (!) 354 lb 3.2 oz (160.7 kg)   SpO2 100%   BMI 50.82 kg/m   GEN: Pleasant, interactive, well-appearing; morbidly obese; in no acute distress HEENT:  Normocephalic and atraumatic. EACs patent bilaterally. TM pearly gray with present light reflex bilaterally. PERRLA. Sclera white. Nasal turbinates pink, moist and patent bilaterally. No rhinorrhea present. Oropharynx pink and moist, without exudate or edema. No lesions, ulcerations, or postnasal drip. Mallampati II NECK:   Supple w/ fair ROM. No JVD present. Normal carotid impulses w/o bruits. Thyroid symmetrical with no goiter or nodules palpated. No lymphadenopathy.   CV: RRR, no m/r/g, no peripheral edema. Pulses intact, +2 bilaterally. No cyanosis, pallor or clubbing. PULMONARY:  Unlabored, regular breathing. Clear bilaterally A&P w/o wheezes/rales/rhonchi. No accessory muscle use.  GI: BS present and normoactive. Soft, non-tender to palpation. No organomegaly or masses detected.  MSK: No erythema, warmth or tenderness. Cap refil <2 sec all extrem. No deformities or joint swelling noted.  Neuro: A/Ox3. No focal deficits noted.   Skin: Warm, no lesions or rashe Psych: Normal affect and behavior. Judgement and thought content appropriate.     Lab Results:  CBC    Component Value Date/Time   WBC 9.2 05/12/2009 0620   RBC 3.35 (L) 05/12/2009 0620   HGB 10.5 (L) 05/12/2009 0620   HCT 30.7 (L) 05/12/2009 0620   PLT 283 05/12/2009 0620   MCV 91.7 05/12/2009 0620   MCHC 34.3 05/12/2009 0620   RDW 14.0 05/12/2009 0620   LYMPHSABS 2.8 09/02/2007 2043   MONOABS 0.5 09/02/2007 2043   EOSABS 0.1 09/02/2007 2043   BASOSABS 0.0 09/02/2007 2043    BMET    Component Value Date/Time   NA 137 05/11/2009 0257   K 4.3 05/11/2009 0257   CL 107 05/11/2009 0257   CO2 23 05/11/2009 0257   GLUCOSE 91 05/11/2009 0257   BUN 8 05/11/2009 0257   CREATININE 0.73 05/11/2009 0257   CALCIUM 9.3 05/11/2009 0257   GFRNONAA >60 05/11/2009 0257   GFRAA  05/11/2009 0257    >60        The eGFR has been calculated using the MDRD equation. This calculation has not been validated in all clinical situations. eGFR's persistently <60 mL/min signify possible Chronic Kidney Disease.    BNP No results found for: "BNP"   Imaging:  No results found.        No data to display          No results found for: "NITRICOXIDE"      Assessment & Plan:   OSA (obstructive sleep apnea) Excellent control on auto  CPAP 5-12cmH2O; residual AHI 0.2. She is using it most nights with 97% compliance; only getting 63% >4 hr which seems to be related to her lack of sleep recently more so than decreased compliance. This is also contributing to her persistent fatigue symptoms. Encouraged her to aim on getting 7-8 hours of sleep a night. Cautioned on safe driving practices.   Patient Instructions  Increase CPAP usage to every night, minimum of 4-6 hours a night.  Change equipment every 30 days or as directed by DME. Wash your tubing with warm soap and water daily, hang to dry. Wash humidifier portion weekly.  Be aware of reduced alertness and do not drive or operate heavy machinery if experiencing this or drowsiness.  Exercise encouraged, as tolerated. Avoid or decrease alcohol consumption and medications that make you more sleepy, if  possible. Notify if persistent daytime sleepiness occurs even with consistent use of CPAP.  Aim for 7-8 hours of sleep a night  Good luck with your new business!  Follow up in 6 months with Dr. Elsworth Soho or sooner if needed    Morbid obesity due to excess calories ([place]) BMI 50. Healthy weight loss encouraged  I spent 25 minutes of dedicated to the care of this patient on the date of this encounter to include pre-visit review of records, face-to-face time with the patient discussing conditions above, post visit ordering of testing, clinical documentation with the electronic health record, making appropriate referrals as documented, and communicating necessary findings to members of the patients care team.  Clayton Bibles, NP 07/03/2022  Pt aware and understands NP's role.

## 2022-07-03 ENCOUNTER — Encounter: Payer: Self-pay | Admitting: Nurse Practitioner

## 2022-07-03 NOTE — Assessment & Plan Note (Signed)
BMI 50. Healthy weight loss encouraged

## 2022-07-03 NOTE — Assessment & Plan Note (Signed)
Excellent control on auto CPAP 5-12cmH2O; residual AHI 0.2. She is using it most nights with 97% compliance; only getting 63% >4 hr which seems to be related to her lack of sleep recently more so than decreased compliance. This is also contributing to her persistent fatigue symptoms. Encouraged her to aim on getting 7-8 hours of sleep a night. Cautioned on safe driving practices.   Patient Instructions  Increase CPAP usage to every night, minimum of 4-6 hours a night.  Change equipment every 30 days or as directed by DME. Wash your tubing with warm soap and water daily, hang to dry. Wash humidifier portion weekly.  Be aware of reduced alertness and do not drive or operate heavy machinery if experiencing this or drowsiness.  Exercise encouraged, as tolerated. Avoid or decrease alcohol consumption and medications that make you more sleepy, if possible. Notify if persistent daytime sleepiness occurs even with consistent use of CPAP.  Aim for 7-8 hours of sleep a night  Good luck with your new business!  Follow up in 6 months with Dr. Elsworth Soho or sooner if needed

## 2022-10-12 ENCOUNTER — Ambulatory Visit (INDEPENDENT_AMBULATORY_CARE_PROVIDER_SITE_OTHER): Payer: 59 | Admitting: Internal Medicine

## 2022-10-12 ENCOUNTER — Encounter (INDEPENDENT_AMBULATORY_CARE_PROVIDER_SITE_OTHER): Payer: Self-pay | Admitting: Internal Medicine

## 2022-10-12 VITALS — BP 140/85 | HR 87 | Temp 98.5°F | Ht 68.0 in

## 2022-10-12 DIAGNOSIS — R030 Elevated blood-pressure reading, without diagnosis of hypertension: Secondary | ICD-10-CM | POA: Diagnosis not present

## 2022-10-12 DIAGNOSIS — Z6841 Body Mass Index (BMI) 40.0 and over, adult: Secondary | ICD-10-CM

## 2022-10-12 DIAGNOSIS — Z0289 Encounter for other administrative examinations: Secondary | ICD-10-CM

## 2022-10-12 DIAGNOSIS — G4733 Obstructive sleep apnea (adult) (pediatric): Secondary | ICD-10-CM

## 2022-10-12 NOTE — Progress Notes (Unsigned)
Office: [phone]  /  Fax: [phone]   Initial Visit  Lindsey Bowman was seen in clinic today to evaluate for obesity. She is interested in losing weight to improve overall health and reduce the risk of weight related complications. She presents today to review program treatment options, initial physical assessment, and evaluation.     She was referred by: PCP  When asked what else they would like to accomplish? She states: Improve energy levels and physical activity, Improve existing medical conditions, Improve quality of life, Improve appearance, and Improve self-confidence  When asked how has your weight affected you? She states: Has affected self-esteem, Relationships, Contributed to medical problems, Contributed to orthopedic problems or mobility issues, Having fatigue, and Having poor endurance  Some associated conditions: Prediabetes  Contributing factors: Family history, Disruption of circadian rhythm, Stress, Reduced physical activity, and Pregnancy  Weight promoting medications identified: None  Current nutrition plan: None  Current level of physical activity: None  Current or previous pharmacotherapy: GLP-1  Response to medication: Lost weight initially but was unable to sustain weight loss   Past medical history includes:   Past Medical History:  Diagnosis Date   HSV-2 infection    No pertinent past medical history    Uterine polyp      Objective:   BP (!) 140/85   Pulse 87   Temp 98.5 F (36.9 C)   Ht 5\' 8"  (1.727 m)   BMI 53.86 kg/m  She was weighed on the bioimpedance scale: Body mass index is 53.86 kg/m.  Peak Weight:361 , Body Fat%:55, Visceral Fat Rating:22, Weight trend over the last 12 months: Increasing  General:  Alert, oriented and cooperative. Patient is in no acute distress.  Respiratory: Normal respiratory effort, no problems with respiration noted  Extremities: Normal range of motion.    Mental Status: Normal mood and affect.  Normal behavior. Normal judgment and thought content.   DIAGNOSTIC DATA REVIEWED:  BMET    Component Value Date/Time   NA 137 05/11/2009 0257   K 4.3 05/11/2009 0257   CL 107 05/11/2009 0257   CO2 23 05/11/2009 0257   GLUCOSE 91 05/11/2009 0257   BUN 8 05/11/2009 0257   CREATININE 0.73 05/11/2009 0257   CALCIUM 9.3 05/11/2009 0257   GFRNONAA >60 05/11/2009 0257   GFRAA  05/11/2009 0257    >60        The eGFR has been calculated using the MDRD equation. This calculation has not been validated in all clinical situations. eGFR's persistently <60 mL/min signify possible Chronic Kidney Disease.   No results found for: "HGBA1C" No results found for: "INSULIN" CBC    Component Value Date/Time   WBC 9.2 05/12/2009 0620   RBC 3.35 (L) 05/12/2009 0620   HGB 10.5 (L) 05/12/2009 0620   HCT 30.7 (L) 05/12/2009 0620   PLT 283 05/12/2009 0620   MCV 91.7 05/12/2009 0620   MCHC 34.3 05/12/2009 0620   RDW 14.0 05/12/2009 0620   Iron/TIBC/Ferritin/ %Sat No results found for: "IRON", "TIBC", "FERRITIN", "IRONPCTSAT" Lipid Panel     Component Value Date/Time   CHOL 147 09/02/2007 2043   TRIG 98 09/02/2007 2043   HDL 58 09/02/2007 2043   CHOLHDL 2.5 Ratio 09/02/2007 2043   VLDL 20 09/02/2007 2043   LDLCALC 69 09/02/2007 2043   Hepatic Function Panel     Component Value Date/Time   PROT 6.0 05/11/2009 0257   ALBUMIN 2.9 (L) 05/11/2009 0257   AST 24 05/11/2009 0257  ALT 15 05/11/2009 0257   ALKPHOS 132 (H) 05/11/2009 0257   BILITOT 0.4 05/11/2009 0257   BILIDIR <0.1 mg/dL 09/02/2007 2043   IBILI NOT CALC mg/dL 09/02/2007 2043      Component Value Date/Time   TSH 0.757 09/02/2007 2043     Assessment and Plan:  There are no diagnoses linked to this encounter.      Obesity Treatment / Action Plan:  {[email or web address]:[id number]::"Patient will work on garnering support from family and friends to begin weight loss journey.","Will work on eliminating or reducing  the presence of highly palatable, calorie dense foods in the home.","Will complete provided nutritional and psychosocial assessment questionnaire before the next appointment.","Will be scheduled for indirect calorimetry to determine resting energy expenditure in a fasting state.  This will allow [place] to create a reduced calorie, high-protein meal plan to promote loss of fat mass while preserving muscle mass."}  Obesity Education Performed Today:  She was weighed on the bioimpedance scale and results were discussed and documented in the synopsis.  We discussed obesity as a disease and the importance of a more detailed evaluation of all the factors contributing to the disease.  We discussed the importance of long term lifestyle changes which include nutrition, exercise and behavioral modifications as well as the importance of customizing this to her specific health and social needs.  We discussed the benefits of reaching a healthier weight to alleviate the symptoms of existing conditions and reduce the risks of the biomechanical, metabolic and psychological effects of obesity.  Lindsey Bowman appears to be in the action stage of change and states they are ready to start intensive lifestyle modifications and behavioral modifications.  *** minutes was spent today on this visit including the above counseling, pre-visit chart review, and post-visit documentation.  Reviewed by clinician on day of visit: allergies, medications, problem list, medical history, surgical history, family history, social history, and previous encounter notes.    I have reviewed the above documentation for accuracy and completeness, and I agree with the above. ***

## 2022-10-13 DIAGNOSIS — R030 Elevated blood-pressure reading, without diagnosis of hypertension: Secondary | ICD-10-CM | POA: Insufficient documentation

## 2022-11-04 ENCOUNTER — Ambulatory Visit (INDEPENDENT_AMBULATORY_CARE_PROVIDER_SITE_OTHER): Payer: 59 | Admitting: Internal Medicine

## 2022-11-04 ENCOUNTER — Encounter (INDEPENDENT_AMBULATORY_CARE_PROVIDER_SITE_OTHER): Payer: Self-pay | Admitting: Internal Medicine

## 2022-11-04 VITALS — BP 138/84 | HR 84 | Temp 98.1°F | Ht 68.0 in | Wt 360.0 lb

## 2022-11-04 DIAGNOSIS — G4733 Obstructive sleep apnea (adult) (pediatric): Secondary | ICD-10-CM | POA: Diagnosis not present

## 2022-11-04 DIAGNOSIS — Z1331 Encounter for screening for depression: Secondary | ICD-10-CM | POA: Diagnosis not present

## 2022-11-04 DIAGNOSIS — R7303 Prediabetes: Secondary | ICD-10-CM | POA: Diagnosis not present

## 2022-11-04 DIAGNOSIS — R030 Elevated blood-pressure reading, without diagnosis of hypertension: Secondary | ICD-10-CM | POA: Diagnosis not present

## 2022-11-04 DIAGNOSIS — R0602 Shortness of breath: Secondary | ICD-10-CM | POA: Insufficient documentation

## 2022-11-04 DIAGNOSIS — R5383 Other fatigue: Secondary | ICD-10-CM | POA: Insufficient documentation

## 2022-11-04 DIAGNOSIS — Z6841 Body Mass Index (BMI) 40.0 and over, adult: Secondary | ICD-10-CM

## 2022-11-04 NOTE — Assessment & Plan Note (Signed)
Patient inquired early in the visit if she would have to wait to start medications.  She had been on Saxenda in the past with good response but discontinued because of how she felt and had problems with constipation.  She had also done Optavia.  We again reviewed our philosophy and approach pertaining to comprehensive weight loss plan.  She will start off with nutritional and behavioral strategies.  We will consider pharmacotherapy in the future if needed.

## 2022-11-04 NOTE — Progress Notes (Unsigned)
Chief Complaint:   OBESITY Lindsey Bowman (MR# [id number]) is a 47 y.o. female who presents for evaluation and treatment of obesity and related comorbidities. Current BMI is Body mass index is 54.74 kg/m. Lindsey Bowman has been struggling with her weight for many years and has been unsuccessful in either losing weight, maintaining weight loss, or reaching her healthy weight goal.  Lindsey Bowman is currently in the action stage of change and ready to dedicate time achieving and maintaining a healthier weight. Lindsey Bowman is interested in becoming our patient and working on intensive lifestyle modifications including (but not limited to) diet and exercise for weight loss.  Lindsey Bowman's habits were reviewed today and are as follows: she thinks her family will eat healthier with her, she struggles with family and or coworkers weight loss sabotage, her desired weight loss is 160 lbs, she has been heavy most of her life, she started gaining weight at age 81, her heaviest weight ever was 361 pounds, she has significant food cravings issues, she snacks frequently in the evenings, she skips meals frequently, she is frequently drinking liquids with calories, she frequently makes poor food choices, she frequently eats larger portions than normal, she has binge eating behaviors, and she struggles with emotional eating.  Depression Screen Lindsey Bowman's Food and Mood (modified PHQ-9) score was 18.  Subjective:   1. Other fatigue Lindsey Bowman admits to daytime somnolence and admits to waking up still tired. Patient has a history of symptoms of daytime fatigue and morning fatigue. Lindsey Bowman generally gets  6-8  hours of sleep per night, and states that she has generally restful sleep. Snoring is present. Apneic episodes are present. Epworth Sleepiness Score is 7.   2. SOB (shortness of breath) on exertion Lindsey Bowman notes increasing shortness of breath with exercising and seems to be worsening over time with weight gain. She notes getting  out of breath sooner with activity than she used to. This has gotten worse recently. Lindsey Bowman denies shortness of breath at rest or orthopnea.  3. Elevated blood pressure reading without diagnosis of hypertension BP not at goal of <=120/80.  4. Prediabetes Per history.   5. OSA (obstructive sleep apnea) On CPAP with reported good compliance.   Assessment/Plan:   1. Other fatigue Lindsey Bowman does feel that her weight is causing her energy to be lower than it should be. Fatigue may be related to obesity, depression or many other causes. Labs will be ordered, and in the meanwhile, Lindsey Bowman will focus on self care including making healthy food choices, increasing physical activity and focusing on stress reduction.  Lab/Orders: - EKG 12-Lead - Vitamin B12  2. SOB (shortness of breath) on exertion Lindsey Bowman does feel that she gets out of breath more easily that she used to when she exercises. Lindsey Bowman's shortness of breath appears to be obesity related and exercise induced. She has agreed to work on weight loss and gradually increase exercise to treat her exercise induced shortness of breath. Will continue to monitor closely.  3. Elevated blood pressure reading without diagnosis of hypertension Patient counseled on elevated blood pressure.  I recommend that she monitor her blood pressure at home in the morning and before bedtime to look at trends.  She may benefit for pharmacotherapy.  Losing 10% of body weight may improve blood pressure control.  We will check renal parameters today  Lab/Orders: - CBC with Differential/Platelet - Comprehensive metabolic panel  4. Prediabetes I do not have any metabolic parameters.  We will check a  fasting blood sugar, hemoglobin A1c and insulin levels.  Lab/Orders: - Hemoglobin A1c - Insulin, random  5. OSA (obstructive sleep apnea) Continue PAP therapy. Losing 15% or more of body weight may improve AHI.    6. Depression screen Lindsey Bowman had a positive depression  screening. Depression is commonly associated with obesity and often results in emotional eating behaviors. We will monitor this closely and work on CBT to help improve the non-hunger eating patterns. Referral to Psychology may be required if no improvement is seen as she continues in our clinic.  7. 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  She had been on Saxenda in the past with good response but discontinued because of how she felt and had problems with constipation.  She had also done Optavia.  We again reviewed our philosophy and approach pertaining to comprehensive weight loss plan.  She will start off with nutritional and behavioral strategies.  We will consider pharmacotherapy in the future if needed.  Lab/Orders: - Hemoglobin A1c - Insulin, random - TSH - VITAMIN D 25 Hydroxy (Vit-D Deficiency, Fractures) - Lipid Panel With LDL/HDL Ratio  Lindsey Bowman is currently in the action stage of change and her goal is to continue with weight loss efforts. I recommend Lindsey Bowman begin the structured treatment plan as follows:  She has agreed to the Category 3 Plan.  Exercise goals: All adults should avoid inactivity. Some physical activity is better than none, and adults who participate in any amount of physical activity gain some health benefits.   Behavioral modification strategies: increasing lean protein intake, decreasing simple carbohydrates, increasing vegetables, increasing water intake, decreasing liquid calories, increasing high fiber foods, no skipping meals, meal planning and cooking strategies, keeping healthy foods in the home, better snacking choices, avoiding temptations, and planning for success.  She was informed of the importance of frequent follow-up visits to maximize her success with intensive lifestyle  modifications for her multiple health conditions. She was informed we would discuss her lab results at her next visit unless there is a critical issue that needs to be addressed sooner. Lindsey Bowman agreed to keep her next visit at the agreed upon time to discuss these results.  Objective:   Blood pressure 138/84, pulse 84, temperature 98.1 F (36.7 C), height 5\' 8"  (1.727 m), weight (!) 360 lb (163.3 kg), last menstrual period 09/20/2022, SpO2 97 %, unknown if currently breastfeeding. Body mass index is 54.74 kg/m.  EKG: Normal sinus rhythm, rate 96.  Indirect Calorimeter completed today shows a VO2 of 304 and a REE of 2102.  Her calculated basal metabolic rate is 7517 thus her basal metabolic rate is worse than expected.  General: Cooperative, alert, well developed, in no acute distress. HEENT: Conjunctivae and lids unremarkable. Cardiovascular: Regular rhythm.  Lungs: Normal work of breathing. Neurologic: No focal deficits.   Lab Results  Component Value Date   CREATININE 0.79 11/04/2022   BUN 9 11/04/2022   NA 137 11/04/2022   K 4.8 11/04/2022   CL 101 11/04/2022   CO2 25 11/04/2022   Lab Results  Component Value Date   ALT 17 11/04/2022   AST 13 11/04/2022   ALKPHOS 48 11/04/2022   BILITOT 0.3 11/04/2022   Lab Results  Component Value Date   HGBA1C 6.4 (H) 11/04/2022   Lab Results  Component Value Date  INSULIN 57.0 (H) 11/04/2022   Lab Results  Component Value Date   TSH 1.760 11/04/2022   Lab Results  Component Value Date   CHOL 154 11/04/2022   HDL 54 11/04/2022   LDLCALC 85 11/04/2022   TRIG 78 11/04/2022   CHOLHDL 2.5 Ratio 09/02/2007   Lab Results  Component Value Date   WBC 5.7 11/04/2022   HGB 12.3 11/04/2022   HCT 39.1 11/04/2022   MCV 89 11/04/2022   PLT 440 11/04/2022    Attestation Statements:   Reviewed by clinician on day of visit: allergies, medications, problem list, medical history, surgical history, family history, social history, and  previous encounter notes.  Time spent on visit including pre-visit chart review and post-visit charting and care was 40 minutes.   I, Kathlene November, BS, CMA, am acting as transcriptionist for Thomes Dinning, MD.  I have reviewed the above documentation for accuracy and completeness, and I agree with the above. -Thomes Dinning, MD

## 2022-11-04 NOTE — Assessment & Plan Note (Signed)
On CPAP with reported good compliance. Continue PAP therapy. Losing 15% or more of body weight may improve AHI.

## 2022-11-04 NOTE — Assessment & Plan Note (Signed)
Patient counseled on elevated blood pressure.  I recommend that she monitor her blood pressure at home in the morning and before bedtime to look at trends.  She may benefit for pharmacotherapy.  Losing 10% of body weight may improve blood pressure control.  We will check renal parameters today

## 2022-11-04 NOTE — Assessment & Plan Note (Signed)
Per history.  I do not have any metabolic parameters.  We will check a fasting blood sugar, hemoglobin A1c and insulin levels.

## 2022-11-05 LAB — CBC WITH DIFFERENTIAL/PLATELET
Basophils Absolute: 0 10*3/uL (ref 0.0–0.2)
Basos: 1 %
EOS (ABSOLUTE): 0.1 10*3/uL (ref 0.0–0.4)
Eos: 2 %
Hematocrit: 39.1 % (ref 34.0–46.6)
Hemoglobin: 12.3 g/dL (ref 11.1–15.9)
Immature Grans (Abs): 0 10*3/uL (ref 0.0–0.1)
Immature Granulocytes: 0 %
Lymphocytes Absolute: 2 10*3/uL (ref 0.7–3.1)
Lymphs: 36 %
MCH: 28 pg (ref 26.6–33.0)
MCHC: 31.5 g/dL (ref 31.5–35.7)
MCV: 89 fL (ref 79–97)
Monocytes Absolute: 0.4 10*3/uL (ref 0.1–0.9)
Monocytes: 7 %
Neutrophils Absolute: 3 10*3/uL (ref 1.4–7.0)
Neutrophils: 54 %
Platelets: 440 10*3/uL (ref 150–450)
RBC: 4.4 x10E6/uL (ref 3.77–5.28)
RDW: 12.3 % (ref 11.7–15.4)
WBC: 5.7 10*3/uL (ref 3.4–10.8)

## 2022-11-05 LAB — COMPREHENSIVE METABOLIC PANEL
ALT: 17 IU/L (ref 0–32)
AST: 13 IU/L (ref 0–40)
Albumin/Globulin Ratio: 1.3 (ref 1.2–2.2)
Albumin: 3.9 g/dL (ref 3.9–4.9)
Alkaline Phosphatase: 48 IU/L (ref 44–121)
BUN/Creatinine Ratio: 11 (ref 9–23)
BUN: 9 mg/dL (ref 6–24)
Bilirubin Total: 0.3 mg/dL (ref 0.0–1.2)
CO2: 25 mmol/L (ref 20–29)
Calcium: 9.5 mg/dL (ref 8.7–10.2)
Chloride: 101 mmol/L (ref 96–106)
Creatinine, Ser: 0.79 mg/dL (ref 0.57–1.00)
Globulin, Total: 2.9 g/dL (ref 1.5–4.5)
Glucose: 99 mg/dL (ref 70–99)
Potassium: 4.8 mmol/L (ref 3.5–5.2)
Sodium: 137 mmol/L (ref 134–144)
Total Protein: 6.8 g/dL (ref 6.0–8.5)
eGFR: 93 mL/min/{1.73_m2} (ref >59)

## 2022-11-05 LAB — LIPID PANEL WITH LDL/HDL RATIO
Cholesterol, Total: 154 mg/dL (ref 100–199)
HDL: 54 mg/dL (ref >39)
LDL Chol Calc (NIH): 85 mg/dL (ref 0–99)
LDL/HDL Ratio: 1.6 ratio (ref 0.0–3.2)
Triglycerides: 78 mg/dL (ref 0–149)
VLDL Cholesterol Cal: 15 mg/dL (ref 5–40)

## 2022-11-05 LAB — VITAMIN D 25 HYDROXY (VIT D DEFICIENCY, FRACTURES): Vit D, 25-Hydroxy: 22.5 ng/mL — ABNORMAL LOW (ref 30.0–100.0)

## 2022-11-05 LAB — TSH: TSH: 1.76 u[IU]/mL (ref 0.450–4.500)

## 2022-11-05 LAB — HEMOGLOBIN A1C
Est. average glucose Bld gHb Est-mCnc: 137 mg/dL
Hgb A1c MFr Bld: 6.4 % — ABNORMAL HIGH (ref 4.8–5.6)

## 2022-11-05 LAB — VITAMIN B12: Vitamin B-12: 428 pg/mL (ref 232–1245)

## 2022-11-05 LAB — INSULIN, RANDOM: INSULIN: 57 u[IU]/mL — ABNORMAL HIGH (ref 2.6–24.9)

## 2022-11-18 ENCOUNTER — Encounter (INDEPENDENT_AMBULATORY_CARE_PROVIDER_SITE_OTHER): Payer: Self-pay | Admitting: Internal Medicine

## 2022-11-18 ENCOUNTER — Ambulatory Visit (INDEPENDENT_AMBULATORY_CARE_PROVIDER_SITE_OTHER): Payer: 59 | Admitting: Internal Medicine

## 2022-11-18 VITALS — BP 136/84 | HR 86 | Temp 98.3°F | Ht 68.0 in | Wt 362.0 lb

## 2022-11-18 DIAGNOSIS — R030 Elevated blood-pressure reading, without diagnosis of hypertension: Secondary | ICD-10-CM

## 2022-11-18 DIAGNOSIS — Z6841 Body Mass Index (BMI) 40.0 and over, adult: Secondary | ICD-10-CM

## 2022-11-18 DIAGNOSIS — R7303 Prediabetes: Secondary | ICD-10-CM | POA: Diagnosis not present

## 2022-11-18 DIAGNOSIS — E559 Vitamin D deficiency, unspecified: Secondary | ICD-10-CM | POA: Diagnosis not present

## 2022-11-18 MED ORDER — VITAMIN D (ERGOCALCIFEROL) 1.25 MG (50000 UNIT) PO CAPS: 50000.0000 [IU] | ORAL_CAPSULE | ORAL | 0 refills | Status: DC

## 2022-11-18 MED ORDER — METFORMIN HCL ER 500 MG PO TB24: 500.0000 mg | ORAL_TABLET | Freq: Two times a day (BID) | ORAL | 0 refills | Status: DC

## 2022-11-18 NOTE — Assessment & Plan Note (Signed)
Most recent vitamin D levels  Lab Results  Component Value Date   VD25OH 22.5 (L) 11/04/2022     Deficiency state associated with adiposity and may result in leptin resistance, weight gain and fatigue. Currently on vitamin D supplementation without any adverse effects.  Plan: After discussion of benefits, alternative treatment options and side effects patient will be started on vitamin D2 50,000 units 1 tablet weekly for 3-4 months. for a treatment goal level of 50-60 mg/dl. Check levels at that time for response monitoring.

## 2022-11-18 NOTE — Assessment & Plan Note (Signed)
Most recent A1c is  Lab Results  Component Value Date   HGBA1C 6.4 (H) 11/04/2022  Her Homa IR is markedly elevated which suggest severe insulin resistance. Patient informed of disease state and risk of progression. This may contribute to abnormal cravings, fatigue and diabetes complications without having diabetes.   We reviewed treatment options which include losing 7 to 10% of body weight, increasing physical activity to a 150 minutes a week of moderate intensity.after discussion of treatment options medication side effects and benefits she will be started on metformin XR 500 mg twice a day.  She will look into insurance coverage for GLP-1 therapy

## 2022-11-18 NOTE — Assessment & Plan Note (Signed)
Blood pressure remains elevated.  She has obstructive sleep apnea and has prediabetes close to diabetes and also high insulin resistance.  She needs aggressive risk factor reduction.  I recommend she monitor her blood pressure in the morning and also before bedtime and log.  Her blood pressures remain above 130/80 I recommend medical therapy.  She will work with her primary care physician on this.

## 2022-11-18 NOTE — Progress Notes (Signed)
Office: [phone]  /  Fax: [phone]  WEIGHT SUMMARY AND BIOMETRICS  Vitals Temp: 98.3 F (36.8 C) BP: 136/84 Pulse Rate: 86 SpO2: 98 %   Anthropometric Measurements Height: '5\' 8"'$  (1.727 m) Weight: (!) 362 lb (164.2 kg) BMI (Calculated): 55.05 Weight at Last Visit: 360 lb Weight Lost Since Last Visit: +2 lb Starting Weight: 360 lb Total Weight Loss (lbs): 0 lb (0 kg) Peak Weight: 361 lb   Body Composition  Body Fat %: 57.7 % Fat Mass (lbs): 209 lbs Muscle Mass (lbs): 145.6 lbs Total Body Water (lbs): 115.4 lbs Visceral Fat Rating : 24    HPI  Chief Complaint: OBESITY  Lindsey Bowman is here to discuss her progress with her obesity treatment plan. She is on the the Category 3 Plan and states she is following her eating plan approximately 30 % of the time. She states she is not exercising.  Interval History:  Since last office visit she has gained 2 pounds. She reports suboptimal adherence due to working on gradual implementation.  She is only been working on getting breakfast.  And has not been exercising. She has a new job and has been feeling somewhat stressed. '[]'$ Denies '[x]'$ Reports problems with appetite and hunger signals.  '[]'$ Denies '[x]'$ Reports problems with satiety and satiation.  '[x]'$ Denies '[]'$ Reports problems with eating patterns and portion control.  '[]'$ Denies '[x]'$ Reports abnormal cravings Stress levels are reported as medium and due to Work.  Barriers identified lack of time for self-care and inability to focus on healthy eating.   Pharmacotherapy for weight loss: She is currently taking no anti-obesity medication.   Weight promoting medications identified: None.  ASSESSMENT AND PLAN  TREATMENT PLAN FOR OBESITY:  Recommended Dietary Goals  Lindsey Bowman is currently in the action stage of change. As such, her goal is to continue weight management plan. She has agreed to: the Category 3 Plan.  She has been slow to implement changes and remains motivated to begin  her weight loss journey.  We again reviewed overarching goals of reduced calorie nutrition plan included within the context of recent lab abnormalities.  Behavioral Intervention  We discussed the following Behavioral Modification Strategies today: increasing lean protein intake, decreasing simple carbohydrates , increasing vegetables, increasing fiber rich foods, avoiding skipping meals, increasing water intake, identifying sources and decreasing liquid calories, work on meal planning and easy cooking plans, decreasing eating out, consumption of processed foods, and making healthy choices when eating convenient foods, and reading food labels .  Additional resources provided today: None  Recommended Physical Activity Goals  Lindsey Bowman has been advised to work up to 150 minutes of moderate intensity aerobic activity a week and strengthening exercises 2-3 times per week for cardiovascular health, weight loss maintenance and preservation of muscle mass.   She has agreed to :  '[]'$  Continue current level of physical activity  '[x]'$  Think about ways to increase physical activity '[]'$  Start strengthening exercises with a goal of 2-3 sessions a week  '[]'$  Start aerobic activity with a goal of 150 minutes a week at moderate intensity.  '[]'$  Increase the intensity, frequency or duration of strengthening exercises  '[]'$  Increase the intensity, frequency or duration of aerobic exercises   '[]'$  Increase physical activity in their day and reduce sedentary time (increase NEAT). '[]'$  Work on scheduling and tracking physical activity.   Pharmacotherapy We discussed various medication options to help Lindsey Bowman with her weight loss efforts and we both agreed to :  Starting metformin 500 mg XR 1 tablet twice a  day for diabetes prevention and incretin effect.  She would also be a candidate for GLP-1 therapy  ASSOCIATED CONDITIONS ADDRESSED TODAY  Vitamin D deficiency Assessment & Plan: Most recent vitamin D levels  Lab Results   Component Value Date   VD25OH 22.5 (L) 11/04/2022     Deficiency state associated with adiposity and may result in leptin resistance, weight gain and fatigue. Currently on vitamin D supplementation without any adverse effects.  Plan: After discussion of benefits, alternative treatment options and side effects patient will be started on vitamin D2 50,000 units 1 tablet weekly for 3-4 months. for a treatment goal level of 50-60 mg/dl. Check levels at that time for response monitoring.   Orders: -     Vitamin D (Ergocalciferol); Take 1 capsule (50,000 Units total) by mouth every 7 (seven) days.  Dispense: 13 capsule; Refill: 0  Prediabetes Assessment & Plan: Most recent A1c is  Lab Results  Component Value Date   HGBA1C 6.4 (H) 11/04/2022  Her Homa IR is markedly elevated which suggest severe insulin resistance. Patient informed of disease state and risk of progression. This may contribute to abnormal cravings, fatigue and diabetes complications without having diabetes.   We reviewed treatment options which include losing 7 to 10% of body weight, increasing physical activity to a 150 minutes a week of moderate intensity.after discussion of treatment options medication side effects and benefits she will be started on metformin XR 500 mg twice a day.  She will look into insurance coverage for GLP-1 therapy  Orders: -     metFORMIN HCl ER; Take 1 tablet (500 mg total) by mouth 2 (two) times daily with a meal.  Dispense: 60 tablet; Refill: 0  Elevated blood pressure reading without diagnosis of hypertension Assessment & Plan: Blood pressure remains elevated.  She has obstructive sleep apnea and has prediabetes close to diabetes and also high insulin resistance.  She needs aggressive risk factor reduction.  I recommend she monitor her blood pressure in the morning and also before bedtime and log.  Her blood pressures remain above 130/80 I recommend medical therapy.  She will work with her  primary care physician on this.   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 Body mass index is 55.04 kg/m.  General: She is overweight, cooperative, alert, well developed, and in no acute distress. PSYCH: Has normal mood, affect and thought process.   HEENT: EOMI, sclerae are anicteric. Lungs: Normal breathing effort, no conversational dyspnea. Extremities: No edema.  Neurologic: No gross sensory or motor deficits. No tremors or fasciculations noted.    DIAGNOSTIC DATA REVIEWED:  BMET    Component Value Date/Time   NA 137 11/04/2022 0829   K 4.8 11/04/2022 0829   CL 101 11/04/2022 0829   CO2 25 11/04/2022 0829   GLUCOSE 99 11/04/2022 0829   GLUCOSE 91 05/11/2009 0257   BUN 9 11/04/2022 0829   CREATININE 0.79 11/04/2022 0829   CALCIUM 9.5 11/04/2022 0829   GFRNONAA >60 05/11/2009 0257   GFRAA  05/11/2009 0257    >60        The eGFR has been calculated using the MDRD equation. This calculation has not been validated  in all clinical situations. eGFR's persistently <60 mL/min signify possible Chronic Kidney Disease.   Lab Results  Component Value Date   HGBA1C 6.4 (H) 11/04/2022   Lab Results  Component Value Date   INSULIN 57.0 (H) 11/04/2022   Lab Results  Component Value Date   TSH 1.760 11/04/2022   CBC    Component Value Date/Time   WBC 5.7 11/04/2022 0829   WBC 9.2 05/12/2009 0620   RBC 4.40 11/04/2022 0829   RBC 3.35 (L) 05/12/2009 0620   HGB 12.3 11/04/2022 0829   HCT 39.1 11/04/2022 0829   PLT 440 11/04/2022 0829   MCV 89 11/04/2022 0829   MCH 28.0 11/04/2022 0829   MCHC 31.5 11/04/2022 0829   MCHC 34.3 05/12/2009 0620   RDW 12.3  11/04/2022 0829   Iron Studies No results found for: "IRON", "TIBC", "FERRITIN", "IRONPCTSAT" Lipid Panel     Component Value Date/Time   CHOL 154 11/04/2022 0829   TRIG 78 11/04/2022 0829   HDL 54 11/04/2022 0829   CHOLHDL 2.5 Ratio 09/02/2007 2043   VLDL 20 09/02/2007 2043   LDLCALC 85 11/04/2022 0829   Hepatic Function Panel     Component Value Date/Time   PROT 6.8 11/04/2022 0829   ALBUMIN 3.9 11/04/2022 0829   AST 13 11/04/2022 0829   ALT 17 11/04/2022 0829   ALKPHOS 48 11/04/2022 0829   BILITOT 0.3 11/04/2022 0829   BILIDIR <0.1 mg/dL 09/02/2007 2043   IBILI NOT CALC mg/dL 09/02/2007 2043      Component Value Date/Time   TSH 1.760 11/04/2022 [id number]   Nutritional Lab Results  Component Value Date   VD25OH 22.5 (L) 11/04/2022     Return in about 2 weeks (around 12/02/2022) for For Weight Mangement with Dr. Gerarda Fraction.Marland Kitchen She was informed of the importance of frequent follow up visits to maximize her success with intensive lifestyle modifications for her multiple health conditions.   ATTESTASTION STATEMENTS:  Reviewed by clinician on day of visit: allergies, medications, problem list, medical history, surgical history, family history, social history, and previous encounter notes.   Time spent on visit including pre-visit chart review and post-visit care and charting was 40 minutes.    Thomes Dinning, MD

## 2022-12-10 ENCOUNTER — Ambulatory Visit (INDEPENDENT_AMBULATORY_CARE_PROVIDER_SITE_OTHER): Payer: 59 | Admitting: Internal Medicine

## 2022-12-10 ENCOUNTER — Encounter (INDEPENDENT_AMBULATORY_CARE_PROVIDER_SITE_OTHER): Payer: Self-pay | Admitting: Internal Medicine

## 2022-12-10 VITALS — BP 132/84 | HR 84 | Temp 98.2°F | Ht 68.0 in | Wt 366.0 lb

## 2022-12-10 DIAGNOSIS — Z6841 Body Mass Index (BMI) 40.0 and over, adult: Secondary | ICD-10-CM

## 2022-12-10 DIAGNOSIS — R7303 Prediabetes: Secondary | ICD-10-CM

## 2022-12-10 DIAGNOSIS — R632 Polyphagia: Secondary | ICD-10-CM | POA: Insufficient documentation

## 2022-12-10 MED ORDER — QSYMIA 3.75-23 MG PO CP24: ORAL_CAPSULE | ORAL | 0 refills | Status: DC

## 2022-12-10 MED ORDER — METFORMIN HCL ER 500 MG PO TB24: 500.0000 mg | ORAL_TABLET | Freq: Two times a day (BID) | ORAL | 0 refills | Status: DC

## 2022-12-10 NOTE — Assessment & Plan Note (Signed)
Neurohormonal in origin.  Patient has strong hunger signals and impaired satiety and benefits from early use of antiobesity medications.  After discussion of benefits and side effect she will be started on Qsymia.  We reviewed state registry for controlled substances.  Patient also signed controlled substance agreement.

## 2022-12-10 NOTE — Assessment & Plan Note (Signed)
Most recent A1c is  Lab Results  Component Value Date   HGBA1C 6.4 (H) 11/04/2022  Her Homa IR is markedly elevated which suggest severe insulin resistance. Patient informed of disease state and risk of progression. This may contribute to abnormal cravings, fatigue and diabetes complications without having diabetes.   We reviewed treatment options which include losing 7 to 10% of body weight, increasing physical activity to a 150 minutes a week of moderate intensity.  She is currently on metformin XR 500 mg twice a day without any adverse effects.  She will continue on medication.  She is also a good candidate for GLP-1 therapy if not cost prohibitive.

## 2022-12-10 NOTE — Progress Notes (Deleted)
WEIGHT SUMMARY AND BIOMETRICS  No data recorded Anthropometric Measurements Height: 5\' 8"  (1.727 m) Weight at Last Visit: 362 lb Starting Weight: 360 lb Peak Weight: 361 lb   No data recorded Other Clinical Data Fasting: No Labs: No Today's Visit #: 3 Starting Date: 11/04/22    Chief Complaint:   OBESITY Lindsey Bowman is here to discuss her progress with her obesity treatment plan. She is on the the Category 3 Plan and states she is following her eating plan approximately 35 % of the time. She states she is not exercising.   Interim History: ***  Subjective:   1. Prediabetes ***   Assessment/Plan:   1. Prediabetes ***  Aradia {CHL AMB IS/IS NOT:[id number]} currently in the action stage of change. As such, her goal is to {MWMwtloss#1:210800005}. She has agreed to {MWMwtlossportion/plan2:23431}.   Exercise goals: {MWM EXERCISE RECS:23473}  Behavioral modification strategies: {MWMwtlossdietstrategies3:23432}.  Lennis has agreed to follow-up with our clinic in {NUMBER 1-10:22536} weeks. She was informed of the importance of frequent follow-up visits to maximize her success with intensive lifestyle modifications for her multiple health conditions.   ***delete paragraph if no labs orderedChamik was informed we would discuss her lab results at her next visit unless there is a critical issue that needs to be addressed sooner. Dorice agreed to keep her next visit at the agreed upon time to discuss these results.  Objective:   Height 5\' 8"  (1.727 m), unknown if currently breastfeeding. Body mass index is 55.04 kg/m.  General: Cooperative, alert, well developed, in no acute distress. HEENT: Conjunctivae and lids unremarkable. Cardiovascular: Regular rhythm.  Lungs: Normal work of breathing. Neurologic: No focal deficits.   Lab Results  Component Value Date   CREATININE 0.79 11/04/2022   BUN 9 11/04/2022   NA 137 11/04/2022   K 4.8 11/04/2022   CL 101 11/04/2022    CO2 25 11/04/2022   Lab Results  Component Value Date   ALT 17 11/04/2022   AST 13 11/04/2022   ALKPHOS 48 11/04/2022   BILITOT 0.3 11/04/2022   Lab Results  Component Value Date   HGBA1C 6.4 (H) 11/04/2022   Lab Results  Component Value Date   INSULIN 57.0 (H) 11/04/2022   Lab Results  Component Value Date   TSH 1.760 11/04/2022   Lab Results  Component Value Date   CHOL 154 11/04/2022   HDL 54 11/04/2022   LDLCALC 85 11/04/2022   TRIG 78 11/04/2022   CHOLHDL 2.5 Ratio 09/02/2007   Lab Results  Component Value Date   VD25OH 22.5 (L) 11/04/2022   Lab Results  Component Value Date   WBC 5.7 11/04/2022   HGB 12.3 11/04/2022   HCT 39.1 11/04/2022   MCV 89 11/04/2022   PLT 440 11/04/2022   No results found for: "IRON", "TIBC", "FERRITIN"  Obesity Behavioral Intervention:   Approximately 15 minutes were spent on the discussion below.  ASK: We discussed the diagnosis of obesity with Makinzey today and Essa agreed to give [place] permission to discuss obesity behavioral modification therapy today.  ASSESS: Maudine has the diagnosis of obesity and her BMI today is ***. Charlet {ACTION; IS/IS VG:[id number] in the action stage of change.   ADVISE: Britlee was educated on the multiple health risks of obesity as well as the benefit of weight loss to improve her health. She was advised of the need for long term treatment and the importance of lifestyle modifications to improve her current health and to decrease her risk  of future health problems.  AGREE: Multiple dietary modification options and treatment options were discussed and Neka agreed to follow the recommendations documented in the above note.  ARRANGE: Kamyra was educated on the importance of frequent visits to treat obesity as outlined per CMS and USPSTF guidelines and agreed to schedule her next follow up appointment today.  Attestation Statements:   Reviewed by clinician on day of visit: allergies,  medications, problem list, medical history, surgical history, family history, social history, and previous encounter notes.  ***(delete if time-based billing not used)Time spent on visit including pre-visit chart review and post-visit care and charting was *** minutes.   I, ***, am acting as transcriptionist for ***.  I have reviewed the above documentation for accuracy and completeness, and I agree with the above. -  ***

## 2022-12-10 NOTE — Progress Notes (Signed)
Office: [phone]  /  Fax: [phone]  WEIGHT SUMMARY AND BIOMETRICS  Vitals Temp: 98.2 F (36.8 C) BP: 132/84 Pulse Rate: 84 SpO2: 98 %   Anthropometric Measurements Height: 5\' 8"  (1.727 m) Weight: (!) 366 lb (166 kg) BMI (Calculated): 55.66 Weight at Last Visit: 362 lb Weight Gained Since Last Visit: 4 lb Starting Weight: 360 lb Total Weight Loss (lbs): 0 lb (0 kg) Peak Weight: 361 lb   Body Composition  Body Fat %: 57.4 % Fat Mass (lbs): 210.2 lbs Muscle Mass (lbs): 148.2 lbs Visceral Fat Rating : 23    HPI  Chief Complaint: OBESITY  Lindsey Bowman is here to discuss her progress with her obesity treatment plan. She is on the the Category 3 Plan and states she is following her eating plan approximately 50% of the time. She states she is not exercising  Interval History:  Since last office visit she has gained 5 pounds. She reports suboptimal adherence due to problems managing groceries and maintaining healthy foods at home.  Some family members have been going through her food which is affecting her meal prep. She has been working on reading food labels, not skipping meals, increasing protein at every meal, eating more vegetables, drinking more water, avoiding and or reducing liquid calories, making healthier choices, working on gradual implementation of reduced calorie nutrition plan, thinking of starting to exercise, eating out less, and working on meal prepping Reports problems with appetite and hunger signals.  Reports problems with satiety and satiation.  Denies problems with eating patterns and portion control.  Reports abnormal cravings. Denies feeling deprived or restricted.   Barriers identified: lack of access to healthy foods, having difficulty with meal prep and planning, inability to focus on healthy eating, exposure to enticing environments and or relationships, and strong hunger signals and food noise .   Pharmacotherapy for weight loss: She is  currently taking Metformin (off label use for incretin effect and / or insulin resistance and / or diabetes prevention) .    ASSESSMENT AND PLAN  TREATMENT PLAN FOR OBESITY:  Recommended Dietary Goals  Lindsey Bowman is currently in the action stage of change. As such, her goal is to continue weight management plan. She has agreed to: continue current plan  Behavioral Intervention  We discussed the following Behavioral Modification Strategies today: increasing lean protein intake, decreasing simple carbohydrates , increasing vegetables, increasing lower glycemic fruits, increasing fiber rich foods, avoiding skipping meals, increasing water intake, work on meal planning and preparation, and reading food labels .  Additional resources provided today: None  Recommended Physical Activity Goals  Lindsey Bowman has been advised to work up to 150 minutes of moderate intensity aerobic activity a week and strengthening exercises 2-3 times per week for cardiovascular health, weight loss maintenance and preservation of muscle mass.   She has agreed to :  Think about ways to increase physical activity  Pharmacotherapy We discussed various medication options to help Lindsey Bowman with her weight loss efforts and we both agreed to : start anti-obesity medication. In addition to reduced calorie nutrition plan (RCNP), behavioral strategies and physical activity, Lindsey Bowman would benefit from pharmacotherapy to assist with hunger signals, satiety and cravings. This will reduce obesity-related health risks by inducing weight loss, and help reduce food consumption and adherence to Lindsey Bowman).    After discussion of treatment options, mechanisms of action, benefits, side effects, contraindications and shared decision making she is agreeable to starting Qsymia. Patient also made aware that medication is indicated for long-term management  of obesity and the risk of weight regain following discontinuation of treatment and hence the  importance of adhering to medical weight loss plan.  Patient also counseled extensively on teratogenicity of medication.  She is single and is abstinent and very low risk for pregnancy.     ASSOCIATED CONDITIONS ADDRESSED TODAY  Class 3 severe obesity with serious comorbidity and body mass index (BMI) of 50.0 to 59.9 in adult, unspecified obesity type -     Qsymia; Take 1 capsule by mouth daily for 14 days, THEN 2 capsules daily for 14 days.  Dispense: 30 capsule; Refill: 0  Prediabetes Assessment & Plan: Most recent A1c is  Lab Results  Component Value Date   HGBA1C 6.4 (H) 11/04/2022  Her [place] IR is markedly elevated which suggest severe insulin resistance. Patient informed of disease state and risk of progression. This may contribute to abnormal cravings, fatigue and diabetes complications without having diabetes.   We reviewed treatment options which include losing 7 to 10% of body weight, increasing physical activity to a 150 minutes a week of moderate intensity.  She is currently on metformin XR 500 mg twice a day without any adverse effects.  She will continue on medication.  She is also a good candidate for GLP-1 therapy if not cost prohibitive.  Orders: -     metFORMIN HCl ER; Take 1 tablet (500 mg total) by mouth 2 (two) times daily with a meal.  Dispense: 60 tablet; Refill: 0  Polyphagia Assessment & Plan: Neurohormonal in origin.  Patient has strong hunger signals and impaired satiety and benefits from early use of antiobesity medications.  After discussion of benefits and side effect she will be started on Qsymia.  We reviewed state registry for controlled substances.  Patient also signed controlled substance agreement.      PHYSICAL EXAM:  Blood pressure 132/84, pulse 84, temperature 98.2 F (36.8 C), height 5\' 8"  (1.727 m), weight (!) 366 lb (166 kg), SpO2 98 %, unknown if currently breastfeeding. Body mass index is 55.65 kg/m.  General: She is overweight,  cooperative, alert, well developed, and in no acute distress. PSYCH: Has normal mood, affect and thought process.   HEENT: EOMI, sclerae are anicteric. Lungs: Normal breathing effort, no conversational dyspnea. Extremities: No edema.  Neurologic: No gross sensory or motor deficits. No tremors or fasciculations noted.    DIAGNOSTIC DATA REVIEWED:  BMET    Component Value Date/Time   NA 137 11/04/2022 0829   K 4.8 11/04/2022 0829   CL 101 11/04/2022 0829   CO2 25 11/04/2022 0829   GLUCOSE 99 11/04/2022 0829   GLUCOSE 91 05/11/2009 0257   BUN 9 11/04/2022 0829   CREATININE 0.79 11/04/2022 0829   CALCIUM 9.5 11/04/2022 0829   GFRNONAA >60 05/11/2009 0257   GFRAA  05/11/2009 0257    >60        The eGFR has been calculated using the MDRD equation. This calculation has not been validated in all clinical situations. eGFR's persistently <60 mL/min signify possible Chronic Kidney Disease.   Lab Results  Component Value Date   HGBA1C 6.4 (H) 11/04/2022   Lab Results  Component Value Date   INSULIN 57.0 (H) 11/04/2022   Lab Results  Component Value Date   TSH 1.760 11/04/2022   CBC    Component Value Date/Time   WBC 5.7 11/04/2022 0829   WBC 9.2 05/12/2009 0620   RBC 4.40 11/04/2022 0829   RBC 3.35 (L) 05/12/2009 0620   HGB  12.3 11/04/2022 0829   HCT 39.1 11/04/2022 0829   PLT 440 11/04/2022 0829   MCV 89 11/04/2022 0829   MCH 28.0 11/04/2022 0829   MCHC 31.5 11/04/2022 0829   MCHC 34.3 05/12/2009 0620   RDW 12.3 11/04/2022 0829   Iron Studies No results found for: "IRON", "TIBC", "FERRITIN", "IRONPCTSAT" Lipid Panel     Component Value Date/Time   CHOL 154 11/04/2022 0829   TRIG 78 11/04/2022 0829   HDL 54 11/04/2022 0829   CHOLHDL 2.5 Ratio 09/02/2007 2043   VLDL 20 09/02/2007 2043   LDLCALC 85 11/04/2022 0829   Hepatic Function Panel     Component Value Date/Time   PROT 6.8 11/04/2022 0829   ALBUMIN 3.9 11/04/2022 0829   AST 13 11/04/2022 0829    ALT 17 11/04/2022 0829   ALKPHOS 48 11/04/2022 0829   BILITOT 0.3 11/04/2022 0829   BILIDIR <0.1 mg/dL 09/02/2007 2043   IBILI NOT CALC mg/dL 09/02/2007 2043      Component Value Date/Time   TSH 1.760 11/04/2022 [id number]   Nutritional Lab Results  Component Value Date   VD25OH 22.5 (L) 11/04/2022     Return in about 3 weeks (around 12/31/2022) for For Weight Mangement with Dr. Gerarda Fraction for med titration.Marland Kitchen She was informed of the importance of frequent follow up visits to maximize her success with intensive lifestyle modifications for her multiple health conditions.   ATTESTASTION STATEMENTS:  Reviewed by clinician on day of visit: allergies, medications, problem list, medical history, surgical history, family history, social history, and previous encounter notes.   I have spent 40 minutes in the care of the patient today including: preparing to see patient (e.g. review and interpretation of tests, old notes ), obtaining and/or reviewing separately obtained history, counseling and educating the patient, ordering medications, test and procedures, documenting clinical information in the electronic or other health care record, care coordination, and reviewing controlled substance agreement as well as counseling on medication safety, conditions of use and weight loss thresholds.   Thomes Dinning, MD

## 2022-12-16 ENCOUNTER — Telehealth (INDEPENDENT_AMBULATORY_CARE_PROVIDER_SITE_OTHER): Payer: Self-pay | Admitting: *Deleted

## 2022-12-22 ENCOUNTER — Encounter (INDEPENDENT_AMBULATORY_CARE_PROVIDER_SITE_OTHER): Payer: Self-pay

## 2023-01-07 ENCOUNTER — Encounter (INDEPENDENT_AMBULATORY_CARE_PROVIDER_SITE_OTHER): Payer: Self-pay | Admitting: Internal Medicine

## 2023-01-07 ENCOUNTER — Ambulatory Visit (INDEPENDENT_AMBULATORY_CARE_PROVIDER_SITE_OTHER): Payer: Managed Care, Other (non HMO) | Admitting: Internal Medicine

## 2023-01-07 VITALS — BP 130/82 | HR 98 | Temp 98.4°F | Ht 68.0 in | Wt 357.0 lb

## 2023-01-07 DIAGNOSIS — R7303 Prediabetes: Secondary | ICD-10-CM | POA: Diagnosis not present

## 2023-01-07 DIAGNOSIS — Z6841 Body Mass Index (BMI) 40.0 and over, adult: Secondary | ICD-10-CM | POA: Diagnosis not present

## 2023-01-07 DIAGNOSIS — R632 Polyphagia: Secondary | ICD-10-CM | POA: Diagnosis not present

## 2023-01-07 MED ORDER — PHENTERMINE-TOPIRAMATE ER 7.5-46 MG PO CP24: 1.0000 | ORAL_CAPSULE | Freq: Every day | ORAL | 0 refills | Status: DC

## 2023-01-07 NOTE — Assessment & Plan Note (Signed)
Neurohormonal in origin.  Patient has strong hunger signals and impaired satiety and benefits from early use of antiobesity medications.  She has responded well to low-dose Qsymia.  We will increase to 7.5 mg / 46 mg.  Patient again counseled on medication side effects.  She is not sexually active and was made aware about teratogenicity.  She may be switching pharmacies to be able to use discount card.

## 2023-01-07 NOTE — Progress Notes (Signed)
Office: [phone]  /  Fax: [phone]  WEIGHT SUMMARY AND BIOMETRICS  Vitals Temp: 98.4 F (36.9 C) BP: 130/82 Pulse Rate: 98 SpO2: 98 %   Anthropometric Measurements Height: 5\' 8"  (1.727 m) Weight: (!) 357 lb (161.9 kg) BMI (Calculated): 54.29 Weight at Last Visit: 366 lb Weight Lost Since Last Visit: 9 lb Starting Weight: 360 lb Total Weight Loss (lbs): 9 lb (4.082 kg) Peak Weight: 361 lb   Body Composition  Body Fat %: 55.8 % Fat Mass (lbs): 199.6 lbs Muscle Mass (lbs): 150 lbs Total Body Water (lbs): 114.8 lbs Visceral Fat Rating : 22    No data recorded Today's Visit #: 5  Starting Date: 11/04/22   HPI  Chief Complaint: OBESITY  Lindsey Bowman is here to discuss her progress with her obesity treatment plan. She is on the the Category 3 Plan and states she is following her eating plan approximately 40 % of the time. She states she is not exercising.  Interval History:  Since last office visit she has 9 pounds.  Last office visit she was started on Qsymia and has been taking medication for about a week and a half.  She has noticed a significant improvement in appetite, hunger signals and satiety. She reports working on implementation of reduced calorie nutritional plan She has been working on not skipping meals, increasing protein intake at every meal, drinking more water, avoiding and or reducing liquid calories, making healthier choices, working on gradual implementation of reduced calorie nutrition plan, thinking of starting to exercise, working on meal prepping, and reducing portion sizes Denies problems with appetite and hunger signals.  Denies problems with satiety and satiation.  Denies problems with eating patterns and portion control.  Reports abnormal cravings. Denies feeling deprived or restricted.   Barriers identified: having difficulty with meal prep and planning, predilection for convenience or prepackaged foods, strong hunger signals and  appetite, and difficulty implementing reduced calorie nutrition plan.   Pharmacotherapy for weight loss: She is currently taking Qsymia.    ASSESSMENT AND PLAN  TREATMENT PLAN FOR OBESITY:  Recommended Dietary Goals  Kallan is currently in the action stage of change. As such, her goal is to continue weight management plan. She has agreed to: continue current plan  Behavioral Intervention  We discussed the following Behavioral Modification Strategies today: increasing lean protein intake, decreasing simple carbohydrates , increasing vegetables, increasing lower glycemic fruits, increasing water intake, continue to practice mindfulness when eating, and planning for success.  Additional resources provided today: None  Recommended Physical Activity Goals  Danijela has been advised to work up to 150 minutes of moderate intensity aerobic activity a week and strengthening exercises 2-3 times per week for cardiovascular health, weight loss maintenance and preservation of muscle mass.   She has agreed to :  Start aerobic activity with a goal of 150 minutes a week at moderate intensity.   Pharmacotherapy We discussed various medication options to help Mckinna with her weight loss efforts and we both agreed to : increase Qsymia 7.5 mg / 46 mg 1 tablet daily.  She is not sexually active.  She is not experiencing medication side effects.  ASSOCIATED CONDITIONS ADDRESSED TODAY  Polyphagia Assessment & Plan: Neurohormonal in origin.  Patient has strong hunger signals and impaired satiety and benefits from early use of antiobesity medications.  She has responded well to low-dose Qsymia.  We will increase to 7.5 mg / 46 mg.  Patient again counseled on medication side effects.  She is  not sexually active and was made aware about teratogenicity.  She may be switching pharmacies to be able to use discount card.   Class 3 severe obesity with serious comorbidity and body mass index (BMI) of 50.0 to 59.9  in adult, unspecified obesity type [facility])  Prediabetes Assessment & Plan: Patient had been on metformin for pharmacoprophylaxis.  She stopped medication as she feels that medication is causing "hypoglycemia".  What I suspect is happening is that she does have significant insulin resistance and also gluconeogenesis some medication may be causing a reduction in hyperglycemia which may be perceived as hypoglycemia in a chronic state.  I did explain to her that she would benefit from treatment.  Incretin therapy is cost prohibitive.  We will consider reintroducing medication down the line once she has lost more weight as this will improve her insulin levels and delay onset of diabetes.  Her hemoglobin A1c has been 6.4.   Other orders -     Phentermine-Topiramate; Take 1 capsule by mouth daily.  Dispense: 30 capsule; Refill: 0    PHYSICAL EXAM:  Blood pressure 130/82, pulse 98, temperature 98.4 F (36.9 C), height 5\' 8"  (1.727 m), weight (!) 357 lb (161.9 kg), SpO2 98 %, unknown if currently breastfeeding. Body mass index is 54.28 kg/m.  General: She is overweight, cooperative, alert, well developed, and in no acute distress. PSYCH: Has normal mood, affect and thought process.   HEENT: EOMI, sclerae are anicteric. Lungs: Normal breathing effort, no conversational dyspnea. Extremities: No edema.  Neurologic: No gross sensory or motor deficits. No tremors or fasciculations noted.    DIAGNOSTIC DATA REVIEWED:  BMET    Component Value Date/Time   NA 137 11/04/2022 0829   K 4.8 11/04/2022 0829   CL 101 11/04/2022 0829   CO2 25 11/04/2022 0829   GLUCOSE 99 11/04/2022 0829   GLUCOSE 91 05/11/2009 0257   BUN 9 11/04/2022 0829   CREATININE 0.79 11/04/2022 0829   CALCIUM 9.5 11/04/2022 0829   GFRNONAA >60 05/11/2009 0257   GFRAA  05/11/2009 0257    >60        The eGFR has been calculated using the MDRD equation. This calculation has not been validated in all  clinical situations. eGFR's persistently <60 mL/min signify possible Chronic Kidney Disease.   Lab Results  Component Value Date   HGBA1C 6.4 (H) 11/04/2022   Lab Results  Component Value Date   INSULIN 57.0 (H) 11/04/2022   Lab Results  Component Value Date   TSH 1.760 11/04/2022   CBC    Component Value Date/Time   WBC 5.7 11/04/2022 0829   WBC 9.2 05/12/2009 0620   RBC 4.40 11/04/2022 0829   RBC 3.35 (L) 05/12/2009 0620   HGB 12.3 11/04/2022 0829   HCT 39.1 11/04/2022 0829   PLT 440 11/04/2022 0829   MCV 89 11/04/2022 0829   MCH 28.0 11/04/2022 0829   MCHC 31.5 11/04/2022 0829   MCHC 34.3 05/12/2009 0620   RDW 12.3 11/04/2022 0829   Iron Studies No results found for: "IRON", "TIBC", "FERRITIN", "IRONPCTSAT" Lipid Panel     Component Value Date/Time   CHOL 154 11/04/2022 0829   TRIG 78 11/04/2022 0829   HDL 54 11/04/2022 0829   CHOLHDL 2.5 Ratio 09/02/2007 2043   VLDL 20 09/02/2007 2043   LDLCALC 85 11/04/2022 0829   Hepatic Function Panel     Component Value Date/Time   PROT 6.8 11/04/2022 0829   ALBUMIN 3.9 11/04/2022 0829  AST 13 11/04/2022 0829   ALT 17 11/04/2022 0829   ALKPHOS 48 11/04/2022 0829   BILITOT 0.3 11/04/2022 0829   BILIDIR <0.1 mg/dL 98/07/9146 8295   IBILI NOT CALC mg/dL 62/13/0865 7846      Component Value Date/Time   TSH 1.760 11/04/2022 0829   Nutritional Lab Results  Component Value Date   VD25OH 22.5 (L) 11/04/2022     Return in about 2 weeks (around 01/21/2023) for For Weight Mangement with Dr. Rikki Spearing.Marland Kitchen She was informed of the importance of frequent follow up visits to maximize her success with intensive lifestyle modifications for her multiple health conditions.   ATTESTASTION STATEMENTS:  Reviewed by clinician on day of visit: allergies, medications, problem list, medical history, surgical history, family history, social history, and previous encounter notes.     Worthy Rancher, MD

## 2023-01-07 NOTE — Assessment & Plan Note (Signed)
Patient had been on metformin for pharmacoprophylaxis.  She stopped medication as she feels that medication is causing "hypoglycemia".  What I suspect is happening is that she does have significant insulin resistance and also gluconeogenesis some medication may be causing a reduction in hyperglycemia which may be perceived as hypoglycemia in a chronic state.  I did explain to her that she would benefit from treatment.  Incretin therapy is cost prohibitive.  We will consider reintroducing medication down the line once she has lost more weight as this will improve her insulin levels and delay onset of diabetes.  Her hemoglobin A1c has been 6.4.

## 2023-01-08 ENCOUNTER — Encounter (INDEPENDENT_AMBULATORY_CARE_PROVIDER_SITE_OTHER): Payer: Self-pay | Admitting: Internal Medicine

## 2023-01-11 ENCOUNTER — Other Ambulatory Visit (INDEPENDENT_AMBULATORY_CARE_PROVIDER_SITE_OTHER): Payer: Self-pay

## 2023-01-11 DIAGNOSIS — R632 Polyphagia: Secondary | ICD-10-CM

## 2023-01-11 MED ORDER — PHENTERMINE-TOPIRAMATE ER 7.5-46 MG PO CP24: 1.0000 | ORAL_CAPSULE | Freq: Every day | ORAL | 0 refills | Status: DC

## 2023-01-12 NOTE — Telephone Encounter (Signed)
Error

## 2023-01-31 ENCOUNTER — Encounter (INDEPENDENT_AMBULATORY_CARE_PROVIDER_SITE_OTHER): Payer: Self-pay | Admitting: Internal Medicine

## 2023-02-04 ENCOUNTER — Ambulatory Visit (INDEPENDENT_AMBULATORY_CARE_PROVIDER_SITE_OTHER): Payer: Managed Care, Other (non HMO) | Admitting: Internal Medicine

## 2023-03-02 ENCOUNTER — Ambulatory Visit (INDEPENDENT_AMBULATORY_CARE_PROVIDER_SITE_OTHER): Payer: Managed Care, Other (non HMO) | Admitting: Internal Medicine

## 2023-03-04 ENCOUNTER — Ambulatory Visit (INDEPENDENT_AMBULATORY_CARE_PROVIDER_SITE_OTHER): Payer: Managed Care, Other (non HMO) | Admitting: Internal Medicine

## 2023-03-04 ENCOUNTER — Encounter (INDEPENDENT_AMBULATORY_CARE_PROVIDER_SITE_OTHER): Payer: Self-pay | Admitting: Internal Medicine

## 2023-03-04 ENCOUNTER — Other Ambulatory Visit (INDEPENDENT_AMBULATORY_CARE_PROVIDER_SITE_OTHER): Payer: Self-pay | Admitting: Internal Medicine

## 2023-03-04 VITALS — BP 123/85 | HR 97 | Temp 98.1°F | Ht 68.0 in | Wt 347.0 lb

## 2023-03-04 DIAGNOSIS — R7303 Prediabetes: Secondary | ICD-10-CM

## 2023-03-04 DIAGNOSIS — R632 Polyphagia: Secondary | ICD-10-CM | POA: Diagnosis not present

## 2023-03-04 DIAGNOSIS — E559 Vitamin D deficiency, unspecified: Secondary | ICD-10-CM | POA: Diagnosis not present

## 2023-03-04 DIAGNOSIS — Z6841 Body Mass Index (BMI) 40.0 and over, adult: Secondary | ICD-10-CM

## 2023-03-04 MED ORDER — PHENTERMINE-TOPIRAMATE ER 7.5-46 MG PO CP24: 1.0000 | ORAL_CAPSULE | Freq: Every day | ORAL | 0 refills | Status: DC

## 2023-03-04 NOTE — Assessment & Plan Note (Signed)
She had a hemoglobin A1c of 6.4 in February.  Patient had been on metformin for pharmacoprophylaxis but stopped medication as she felt that medication was causing "hypoglycemia".  It was likely secondary to high insulin levels.  I will like to repeat her hemoglobin A1c today.

## 2023-03-04 NOTE — Assessment & Plan Note (Signed)
Most recent vitamin D levels  Lab Results  Component Value Date   VD25OH 22.5 (L) 11/04/2022     Deficiency state associated with adiposity and may result in leptin resistance, weight gain and fatigue. Currently on vitamin D supplementation without any adverse effects.  Plan: Check vitamin D levels today if adequate she will be transition to over-the-counter supplementation.

## 2023-03-04 NOTE — Progress Notes (Signed)
Office: [phone]  /  Fax: [phone]  WEIGHT SUMMARY AND BIOMETRICS  Vitals Temp: 98.1 F (36.7 C) BP: 123/85 Pulse Rate: 97 SpO2: 98 %   Anthropometric Measurements Height: 5\' 8"  (1.727 m) Weight: (!) 347 lb (157.4 kg) BMI (Calculated): 52.77 Weight at Last Visit: 357lb Weight Lost Since Last Visit: 10lb Weight Gained Since Last Visit: 0 Starting Weight: 360lb Total Weight Loss (lbs): 19 lb (8.618 kg) Peak Weight: 361lb   Body Composition  Body Fat %: 54.4 % Fat Mass (lbs): 188.8 lbs Muscle Mass (lbs): 150.4 lbs Total Body Water (lbs): 111 lbs Visceral Fat Rating : 21    No data recorded Today's Visit #: 6  Starting Date: 11/04/22   HPI  Chief Complaint: OBESITY  Lindsey Bowman is here to discuss her progress with her obesity treatment plan. She is on the the Category 3 Plan and states she is following her eating plan approximately 40 % of the time. She states she is exercising by dancing and doing housework 5 minutes 3-4 times per week.  Interval History:  Since last office visit she has lost 10 lbs. Has eliminated fried foods.  She is currently on Qsymia 7.5 mg without any adverse effects.  She reports improved reduction in hunger signals, satiety and has not easily enticed by highly palatable foods. She reports fair adherence to reduced calorie nutritional plan. She has been working on not skipping meals, increasing protein intake at every meal, eating more vegetables, controlling orexigenic cues and stimuli, and not drinking enough water  [name] Control: Reports problems with appetite and hunger signals.  Denies problems with satiety and satiation.  Denies problems with eating patterns and portion control.  Denies abnormal cravings. Denies feeling deprived or restricted.   Barriers identified: strong hunger signals and appetite and exposure to enticing environments and or relationships.   Pharmacotherapy for weight loss: She is currently taking  Qsymia with adequate clinical response  and without side effects..    ASSESSMENT AND PLAN  TREATMENT PLAN FOR OBESITY:  Recommended Dietary Goals  Lindsey Bowman is currently in the action stage of change. As such, her goal is to continue weight management plan. She has agreed to: continue current plan  Behavioral Intervention  We discussed the following Behavioral Modification Strategies today: increasing lean protein intake, decreasing simple carbohydrates , increasing vegetables, increasing lower glycemic fruits, increasing fiber rich foods, avoiding skipping meals, increasing water intake, continue to practice mindfulness when eating, and planning for success.  Additional resources provided today: None  Recommended Physical Activity Goals  Lindsey Bowman has been advised to work up to 150 minutes of moderate intensity aerobic activity a week and strengthening exercises 2-3 times per week for cardiovascular health, weight loss maintenance and preservation of muscle mass.   She has agreed to :  Think about ways to increase daily physical activity and overcoming barriers to exercise  Pharmacotherapy We discussed various medication options to help Lindsey Bowman with her weight loss efforts and we both agreed to : continue current anti-obesity medication regimen  ASSOCIATED CONDITIONS ADDRESSED TODAY  Prediabetes Assessment & Plan: She had a hemoglobin A1c of 6.4 in February.  Patient had been on metformin for pharmacoprophylaxis but stopped medication as she felt that medication was causing "hypoglycemia".  It was likely secondary to high insulin levels.  I will like to repeat her hemoglobin A1c today.  Orders: -     Hemoglobin A1c -     Insulin, random  Vitamin D deficiency Assessment & Plan: Most recent  vitamin D levels  Lab Results  Component Value Date   VD25OH 22.5 (L) 11/04/2022     Deficiency state associated with adiposity and may result in leptin resistance, weight gain and fatigue.  Currently on vitamin D supplementation without any adverse effects.  Plan: Check vitamin D levels today if adequate she will be transition to over-the-counter supplementation.   Orders: -     VITAMIN D 25 Hydroxy (Vit-D Deficiency, Fractures)  Class 3 severe obesity with serious comorbidity and body mass index (BMI) of 50.0 to 59.9 in adult, unspecified obesity type [facility]) Assessment & Plan: Patient has lost approximately 6% of total body weight with no portion of that since starting Qsymia.  She is having an adequate clinical response to current dose and therefore we will continue at this dose for a total of 12 weeks, if weight loss at that time is less than 3% medication will be titrated up to the next dose.  She is not experiencing any side effects.  She is also not sexually active.  She was again reminded of medication teratogenicity and that she would need reliable means of contraception if she becomes sexually active.   Polyphagia Assessment & Plan: Neurohormonal in origin.  Patient has strong hunger signals and impaired satiety and benefits from early use of antiobesity medications.  She has responded well to low-dose Qsymia.  Qsymia increased to 7.5 mg / 46 mg on May 6, checkpoint 8/6 for dose escalation if less than 3% of weight loss at current dose.  Patient again counseled on medication side effects.  She is not sexually active and was made aware about teratogenicity.  She may be switching pharmacies to be able to use discount card.   Class 3 severe obesity due to excess calories with serious comorbidity and body mass index (BMI) of 50.0 to 59.9 in adult [facility]) Assessment & Plan: Patient has lost approximately 6% of total body weight with no portion of that since starting Qsymia.  She is having an adequate clinical response to current dose and therefore we will continue at this dose for a total of 12 weeks, if weight loss at that time is less than 3% medication will be titrated up to the  next dose.  She is not experiencing any side effects.  She is also not sexually active.  She was again reminded of medication teratogenicity and that she would need reliable means of contraception if she becomes sexually active.  Orders: -     Phentermine-Topiramate; Take 1 capsule by mouth daily.  Dispense: 30 capsule; Refill: 0    PHYSICAL EXAM:  Blood pressure 123/85, pulse 97, temperature 98.1 F (36.7 C), height 5\' 8"  (1.727 m), weight (!) 347 lb (157.4 kg), SpO2 98 %, unknown if currently breastfeeding. Body mass index is 52.76 kg/m.  General: She is overweight, cooperative, alert, well developed, and in no acute distress. PSYCH: Has normal mood, affect and thought process.   HEENT: EOMI, sclerae are anicteric. Lungs: Normal breathing effort, no conversational dyspnea. Extremities: No edema.  Neurologic: No gross sensory or motor deficits. No tremors or fasciculations noted.    DIAGNOSTIC DATA REVIEWED:  BMET    Component Value Date/Time   NA 137 11/04/2022 0829   K 4.8 11/04/2022 0829   CL 101 11/04/2022 0829   CO2 25 11/04/2022 0829   GLUCOSE 99 11/04/2022 0829   GLUCOSE 91 05/11/2009 0257   BUN 9 11/04/2022 0829   CREATININE 0.79 11/04/2022 0829   CALCIUM 9.5 11/04/2022  8416   GFRNONAA >60 05/11/2009 0257   GFRAA  05/11/2009 0257    >60        The eGFR has been calculated using the MDRD equation. This calculation has not been validated in all clinical situations. eGFR's persistently <60 mL/min signify possible Chronic Kidney Disease.   Lab Results  Component Value Date   HGBA1C 6.4 (H) 11/04/2022   Lab Results  Component Value Date   INSULIN 57.0 (H) 11/04/2022   Lab Results  Component Value Date   TSH 1.760 11/04/2022   CBC    Component Value Date/Time   WBC 5.7 11/04/2022 0829   WBC 9.2 05/12/2009 0620   RBC 4.40 11/04/2022 0829   RBC 3.35 (L) 05/12/2009 0620   HGB 12.3 11/04/2022 0829   HCT 39.1 11/04/2022 0829   PLT 440 11/04/2022 0829    MCV 89 11/04/2022 0829   MCH 28.0 11/04/2022 0829   MCHC 31.5 11/04/2022 0829   MCHC 34.3 05/12/2009 0620   RDW 12.3 11/04/2022 0829   Iron Studies No results found for: "IRON", "TIBC", "FERRITIN", "IRONPCTSAT" Lipid Panel     Component Value Date/Time   CHOL 154 11/04/2022 0829   TRIG 78 11/04/2022 0829   HDL 54 11/04/2022 0829   CHOLHDL 2.5 Ratio 09/02/2007 2043   VLDL 20 09/02/2007 2043   LDLCALC 85 11/04/2022 0829   Hepatic Function Panel     Component Value Date/Time   PROT 6.8 11/04/2022 0829   ALBUMIN 3.9 11/04/2022 0829   AST 13 11/04/2022 0829   ALT 17 11/04/2022 0829   ALKPHOS 48 11/04/2022 0829   BILITOT 0.3 11/04/2022 0829   BILIDIR <0.1 mg/dL 60/63/0160 1093   IBILI NOT CALC mg/dL 23/55/7322 0254      Component Value Date/Time   TSH 1.760 11/04/2022 2706   Nutritional Lab Results  Component Value Date   VD25OH 22.5 (L) 11/04/2022     Return in about 3 weeks (around 03/25/2023) for For Weight Mangement with Dr. Rikki Spearing.Marland Kitchen She was informed of the importance of frequent follow up visits to maximize her success with intensive lifestyle modifications for her multiple health conditions.   ATTESTASTION STATEMENTS:  Reviewed by clinician on day of visit: allergies, medications, problem list, medical history, surgical history, family history, social history, and previous encounter notes.     Worthy Rancher, MD

## 2023-03-04 NOTE — Assessment & Plan Note (Signed)
Patient has lost approximately 6% of total body weight with no portion of that since starting Qsymia.  She is having an adequate clinical response to current dose and therefore we will continue at this dose for a total of 12 weeks, if weight loss at that time is less than 3% medication will be titrated up to the next dose.  She is not experiencing any side effects.  She is also not sexually active.  She was again reminded of medication teratogenicity and that she would need reliable means of contraception if she becomes sexually active.

## 2023-03-04 NOTE — Assessment & Plan Note (Signed)
Neurohormonal in origin.  Patient has strong hunger signals and impaired satiety and benefits from early use of antiobesity medications.  She has responded well to low-dose Qsymia.  Qsymia increased to 7.5 mg / 46 mg on May 6, checkpoint 8/6 for dose escalation if less than 3% of weight loss at current dose.  Patient again counseled on medication side effects.  She is not sexually active and was made aware about teratogenicity.  She may be switching pharmacies to be able to use discount card.

## 2023-03-05 ENCOUNTER — Encounter (INDEPENDENT_AMBULATORY_CARE_PROVIDER_SITE_OTHER): Payer: Self-pay | Admitting: Internal Medicine

## 2023-03-05 LAB — INSULIN, RANDOM

## 2023-03-08 ENCOUNTER — Other Ambulatory Visit (INDEPENDENT_AMBULATORY_CARE_PROVIDER_SITE_OTHER): Payer: Self-pay | Admitting: Internal Medicine

## 2023-03-09 ENCOUNTER — Other Ambulatory Visit (INDEPENDENT_AMBULATORY_CARE_PROVIDER_SITE_OTHER): Payer: Self-pay | Admitting: Internal Medicine

## 2023-03-09 ENCOUNTER — Encounter (INDEPENDENT_AMBULATORY_CARE_PROVIDER_SITE_OTHER): Payer: Self-pay

## 2023-03-09 DIAGNOSIS — E559 Vitamin D deficiency, unspecified: Secondary | ICD-10-CM

## 2023-03-09 LAB — VITAMIN D 25 HYDROXY (VIT D DEFICIENCY, FRACTURES): Vit D, 25-Hydroxy: 30.5 ng/mL (ref 30.0–100.0)

## 2023-03-09 MED ORDER — PHENTERMINE-TOPIRAMATE ER 7.5-46 MG PO CP24: 1.0000 | ORAL_CAPSULE | Freq: Every day | ORAL | 0 refills | Status: DC

## 2023-03-09 MED ORDER — VITAMIN D (ERGOCALCIFEROL) 1.25 MG (50000 UNIT) PO CAPS
50000.0000 [IU] | ORAL_CAPSULE | ORAL | 0 refills | Status: DC
Start: 2023-03-09 — End: 2023-03-30

## 2023-03-10 ENCOUNTER — Telehealth (INDEPENDENT_AMBULATORY_CARE_PROVIDER_SITE_OTHER): Payer: Self-pay | Admitting: Internal Medicine

## 2023-03-10 LAB — HEMOGLOBIN A1C
Est. average glucose Bld gHb Est-mCnc: 134 mg/dL
Hgb A1c MFr Bld: 6.3 % — ABNORMAL HIGH (ref 4.8–5.6)

## 2023-03-10 NOTE — Telephone Encounter (Signed)
I submitted a PA this morning for Phentermine and got a reply that its not covered by insurance. I have notified the patient via MyChart.

## 2023-03-26 ENCOUNTER — Other Ambulatory Visit (INDEPENDENT_AMBULATORY_CARE_PROVIDER_SITE_OTHER): Payer: Self-pay | Admitting: Internal Medicine

## 2023-03-26 DIAGNOSIS — E559 Vitamin D deficiency, unspecified: Secondary | ICD-10-CM

## 2023-03-30 ENCOUNTER — Ambulatory Visit (INDEPENDENT_AMBULATORY_CARE_PROVIDER_SITE_OTHER): Payer: Managed Care, Other (non HMO) | Admitting: Internal Medicine

## 2023-03-30 ENCOUNTER — Encounter (INDEPENDENT_AMBULATORY_CARE_PROVIDER_SITE_OTHER): Payer: Self-pay | Admitting: Internal Medicine

## 2023-03-30 VITALS — BP 118/84 | HR 100 | Temp 98.8°F | Ht 68.0 in | Wt 344.0 lb

## 2023-03-30 DIAGNOSIS — R632 Polyphagia: Secondary | ICD-10-CM

## 2023-03-30 DIAGNOSIS — R7303 Prediabetes: Secondary | ICD-10-CM | POA: Diagnosis not present

## 2023-03-30 DIAGNOSIS — E559 Vitamin D deficiency, unspecified: Secondary | ICD-10-CM

## 2023-03-30 DIAGNOSIS — Z6841 Body Mass Index (BMI) 40.0 and over, adult: Secondary | ICD-10-CM

## 2023-03-30 MED ORDER — VITAMIN D (ERGOCALCIFEROL) 1.25 MG (50000 UNIT) PO CAPS
50000.0000 [IU] | ORAL_CAPSULE | ORAL | 0 refills | Status: DC
Start: 2023-03-30 — End: 2023-06-17

## 2023-03-30 MED ORDER — PHENTERMINE-TOPIRAMATE 7.5-46 MG PO CP24
1.0000 | ORAL_CAPSULE | Freq: Every day | ORAL | 0 refills | Status: DC
Start: 2023-03-30 — End: 2023-05-12

## 2023-03-30 NOTE — Assessment & Plan Note (Signed)
Patient has lost approximately 7% of total body weight since starting Qsymia.  She is having an adequate clinical response to current dose and therefore we will continue at this dose for an additional 6-8 weeks, if weight loss at that time is less than 3% medication will be titrated up to the next dose.  She is not experiencing any side effects.  She is also not sexually active.  She was again reminded of medication teratogenicity and that she would need reliable means of contraception if she becomes sexually active.

## 2023-03-30 NOTE — Assessment & Plan Note (Signed)
She had a hemoglobin A1c of 6.4 in February.  Patient had been on metformin for pharmacoprophylaxis but stopped medication as she felt that medication was causing "hypoglycemia".  It was likely secondary to high insulin levels. Repeat A1c is 6.3, she would benefit from pharmaco prophylaxis. Consider re challenging with metformin is patient is amenable in the future.

## 2023-03-30 NOTE — Progress Notes (Signed)
Office: [phone]  /  Fax: [phone]  WEIGHT SUMMARY AND BIOMETRICS  Vitals Temp: 98.8 F (37.1 C) BP: 118/84 Pulse Rate: 100 SpO2: 99 %   Anthropometric Measurements Height: 5\' 8"  (1.727 m) Weight: (!) 344 lb (156 kg) BMI (Calculated): 52.32 Weight at Last Visit: 347 lb Weight Lost Since Last Visit: 3 lb Weight Gained Since Last Visit: 0 lb Starting Weight: 360 lb Total Weight Loss (lbs): 22 lb (9.979 kg) Peak Weight: 361 lb   Body Composition  Body Fat %: 55.4 % Fat Mass (lbs): 190.6 lbs Muscle Mass (lbs): 145.6 lbs Total Body Water (lbs): 112 lbs Visceral Fat Rating : 21    No data recorded Today's Visit #: 7  Starting Date: 11/04/22   HPI  Chief Complaint: OBESITY  Lindsey Bowman is here to discuss her progress with her obesity treatment plan. She is on the the Category 3 Plan and states she is following her eating plan approximately 50 % of the time. She states she is exercising 30 minutes 2 times per week.  Interval History:  Since last office visit she has lost 3 pounds. Notes decrease in mindless eating or eating at night. She reports fair adherence to reduced calorie nutritional plan. Does not like meat. Has been trying to get protein from shakes and vegetables. She has been working on not skipping meals, increasing protein intake at every meal, eating more vegetables, drinking more water, avoiding and / or reducing liquid calories, making healthier choices, begun to exercise, and reducing portion sizes  [name] Control: Denies problems with appetite and hunger signals.  Denies problems with satiety and satiation.  Denies problems with eating patterns and portion control.  Denies abnormal cravings. Denies feeling deprived or restricted.  Overall orixegenic  control has improved.    Barriers identified: none.   Pharmacotherapy for weight loss: She is currently taking Qsymia with adequate clinical response  and without side effects..     ASSESSMENT AND PLAN  TREATMENT PLAN FOR OBESITY:  Recommended Dietary Goals  Lindsey Bowman is currently in the action stage of change. As such, her goal is to continue weight management plan. She has agreed to: continue current plan  Behavioral Intervention  We discussed the following Behavioral Modification Strategies today: increasing lean protein intake, decreasing simple carbohydrates , increasing vegetables, increasing lower glycemic fruits, increasing water intake, work on meal planning and preparation, and planning for success.  Additional resources provided today: Handout on how to make protein smoothies  Recommended Physical Activity Goals  Terryann has been advised to work up to 150 minutes of moderate intensity aerobic activity a week and strengthening exercises 2-3 times per week for cardiovascular health, weight loss maintenance and preservation of muscle mass.   She has agreed to :  Think about ways to increase daily physical activity and overcoming barriers to exercise and will look at dancing, aerobic videos on youtube. She likes to dance.  Pharmacotherapy We discussed various medication options to help Lindsey Bowman with her weight loss efforts and we both agreed to : continue current anti-obesity medication regimen  ASSOCIATED CONDITIONS ADDRESSED TODAY  Prediabetes Assessment & Plan: She had a hemoglobin A1c of 6.4 in February.  Patient had been on metformin for pharmacoprophylaxis but stopped medication as she felt that medication was causing "hypoglycemia".  It was likely secondary to high insulin levels. Repeat A1c is 6.3, she would benefit from pharmaco prophylaxis. Consider re challenging with metformin is patient is amenable in the future.   Class 3 severe obesity  due to excess calories with serious comorbidity and body mass index (BMI) of 50.0 to 59.9 in adult [facility]) Assessment & Plan: Patient has lost approximately 7% of total body weight since starting Qsymia.  She  is having an adequate clinical response to current dose and therefore we will continue at this dose for an additional 6-8 weeks, if weight loss at that time is less than 3% medication will be titrated up to the next dose.  She is not experiencing any side effects.  She is also not sexually active.  She was again reminded of medication teratogenicity and that she would need reliable means of contraception if she becomes sexually active.  Orders: -     Phentermine-Topiramate; Take 1 capsule by mouth daily.  Dispense: 30 capsule; Refill: 0  Vitamin D deficiency Assessment & Plan: Most recent vitamin D levels  Lab Results  Component Value Date   VD25OH 30.5 03/04/2023   VD25OH 22.5 (L) 11/04/2022     Deficiency state associated with adiposity and may result in leptin resistance, weight gain and fatigue. Currently on vitamin D supplementation without any adverse effects.  Plan: Vitamin D still low normal but improved. Continue HD for another 3 months and transition to OTC.    Orders: -     Vitamin D (Ergocalciferol); Take 1 capsule (50,000 Units total) by mouth every 7 (seven) days.  Dispense: 13 capsule; Refill: 0  Polyphagia Assessment & Plan: Neurohormonal in origin. She has responded well to Qsymia.  Continue at current dose.      PHYSICAL EXAM:  Blood pressure 118/84, pulse 100, temperature 98.8 F (37.1 C), height 5\' 8"  (1.727 m), weight (!) 344 lb (156 kg), SpO2 99%, unknown if currently breastfeeding. Body mass index is 52.31 kg/m.  General: She is overweight, cooperative, alert, well developed, and in no acute distress. PSYCH: Has normal mood, affect and thought process.   HEENT: EOMI, sclerae are anicteric. Lungs: Normal breathing effort, no conversational dyspnea. Extremities: No edema.  Neurologic: No gross sensory or motor deficits. No tremors or fasciculations noted.    DIAGNOSTIC DATA REVIEWED:  BMET    Component Value Date/Time   NA 137 11/04/2022 0829   K  4.8 11/04/2022 0829   CL 101 11/04/2022 0829   CO2 25 11/04/2022 0829   GLUCOSE 99 11/04/2022 0829   GLUCOSE 91 05/11/2009 0257   BUN 9 11/04/2022 0829   CREATININE 0.79 11/04/2022 0829   CALCIUM 9.5 11/04/2022 0829   GFRNONAA >60 05/11/2009 0257   GFRAA  05/11/2009 0257    >60        The eGFR has been calculated using the MDRD equation. This calculation has not been validated in all clinical situations. eGFR's persistently <60 mL/min signify possible Chronic Kidney Disease.   Lab Results  Component Value Date   HGBA1C 6.3 (H) 03/04/2023   HGBA1C 6.4 (H) 11/04/2022   Lab Results  Component Value Date   INSULIN 43.7 (H) 03/04/2023   INSULIN 57.0 (H) 11/04/2022   Lab Results  Component Value Date   TSH 1.760 11/04/2022   CBC    Component Value Date/Time   WBC 5.7 11/04/2022 0829   WBC 9.2 05/12/2009 0620   RBC 4.40 11/04/2022 0829   RBC 3.35 (L) 05/12/2009 0620   HGB 12.3 11/04/2022 0829   HCT 39.1 11/04/2022 0829   PLT 440 11/04/2022 0829   MCV 89 11/04/2022 0829   MCH 28.0 11/04/2022 0829   MCHC 31.5 11/04/2022 0829   MCHC  34.3 05/12/2009 0620   RDW 12.3 11/04/2022 0829   Iron Studies No results found for: "IRON", "TIBC", "FERRITIN", "IRONPCTSAT" Lipid Panel     Component Value Date/Time   CHOL 154 11/04/2022 0829   TRIG 78 11/04/2022 0829   HDL 54 11/04/2022 0829   CHOLHDL 2.5 Ratio 09/02/2007 2043   VLDL 20 09/02/2007 2043   LDLCALC 85 11/04/2022 0829   Hepatic Function Panel     Component Value Date/Time   PROT 6.8 11/04/2022 0829   ALBUMIN 3.9 11/04/2022 0829   AST 13 11/04/2022 0829   ALT 17 11/04/2022 0829   ALKPHOS 48 11/04/2022 0829   BILITOT 0.3 11/04/2022 0829   BILIDIR <0.1 mg/dL 16/60/6301 6010   IBILI NOT CALC mg/dL 93/23/5573 2202      Component Value Date/Time   TSH 1.760 11/04/2022 0829   Nutritional Lab Results  Component Value Date   VD25OH 30.5 03/04/2023   VD25OH 22.5 (L) 11/04/2022     Return in about 6 weeks  (around 05/11/2023) for For Weight Mangement with Dr. Rikki Spearing.Marland Kitchen She was informed of the importance of frequent follow up visits to maximize her success with intensive lifestyle modifications for her multiple health conditions.   ATTESTASTION STATEMENTS:  Reviewed by clinician on day of visit: allergies, medications, problem list, medical history, surgical history, family history, social history, and previous encounter notes.     Worthy Rancher, MD

## 2023-03-30 NOTE — Assessment & Plan Note (Signed)
Neurohormonal in origin. She has responded well to Qsymia.  Continue at current dose.

## 2023-03-30 NOTE — Assessment & Plan Note (Signed)
Most recent vitamin D levels  Lab Results  Component Value Date   VD25OH 30.5 03/04/2023   VD25OH 22.5 (L) 11/04/2022     Deficiency state associated with adiposity and may result in leptin resistance, weight gain and fatigue. Currently on vitamin D supplementation without any adverse effects.  Plan: Vitamin D still low normal but improved. Continue HD for another 3 months and transition to OTC.

## 2023-05-12 ENCOUNTER — Ambulatory Visit (INDEPENDENT_AMBULATORY_CARE_PROVIDER_SITE_OTHER): Payer: Managed Care, Other (non HMO) | Admitting: Internal Medicine

## 2023-05-12 ENCOUNTER — Encounter (INDEPENDENT_AMBULATORY_CARE_PROVIDER_SITE_OTHER): Payer: Self-pay | Admitting: Internal Medicine

## 2023-05-12 VITALS — BP 116/81 | HR 84 | Temp 98.1°F | Ht 68.0 in | Wt 333.0 lb

## 2023-05-12 DIAGNOSIS — R638 Other symptoms and signs concerning food and fluid intake: Secondary | ICD-10-CM | POA: Diagnosis not present

## 2023-05-12 DIAGNOSIS — F439 Reaction to severe stress, unspecified: Secondary | ICD-10-CM | POA: Diagnosis not present

## 2023-05-12 DIAGNOSIS — R7303 Prediabetes: Secondary | ICD-10-CM | POA: Diagnosis not present

## 2023-05-12 DIAGNOSIS — Z6841 Body Mass Index (BMI) 40.0 and over, adult: Secondary | ICD-10-CM

## 2023-05-12 MED ORDER — PHENTERMINE-TOPIRAMATE 7.5-46 MG PO CP24
1.0000 | ORAL_CAPSULE | Freq: Every day | ORAL | 0 refills | Status: DC
Start: 2023-05-12 — End: 2023-06-17

## 2023-05-12 NOTE — Assessment & Plan Note (Signed)
Lindsey Bowman has lost 33 pounds or 10% of total body weight since starting Qsymia.  She is having an adequate clinical response at current dose and therefore we will continue at this dose for an additional 6-8 weeks. She is not experiencing any side effects.  She is also not sexually active and low risk for pregnancy.  She was again reminded of medication teratogenicity and that she would need reliable means of contraception if she becomes sexually active.  She is dealing with household stress.  And will be working on getting back on track.

## 2023-05-12 NOTE — Assessment & Plan Note (Signed)
Patient counseled on the importance of self-care.  She has access to telehealth for behavioral health if needed.  I recommend that she begin to exercise again and work on implementing nutritional plan.  Also to stay focused on both scale and non scale accomplishments.

## 2023-05-12 NOTE — Progress Notes (Signed)
Office: [phone]  /  Fax: [phone]  WEIGHT SUMMARY AND BIOMETRICS  Vitals Temp: 98.1 F (36.7 C) BP: 116/81 Pulse Rate: 84 SpO2: 99 %   Anthropometric Measurements Height: 5\' 8"  (1.727 m) Weight: (!) 333 lb (151 kg) BMI (Calculated): 50.64 Weight at Last Visit: 344 lb Weight Lost Since Last Visit: 11 lb Weight Gained Since Last Visit: 0 lb Starting Weight: 360 lb Total Weight Loss (lbs): 33 lb (15 kg) Peak Weight: 361 lb   Body Composition  Body Fat %: 55.6 % Fat Mass (lbs): 185.4 lbs Muscle Mass (lbs): 111.2 lbs Total Body Water (lbs): 111.2 lbs Visceral Fat Rating : 20    No data recorded Today's Visit #: 8  Starting Date: 11/04/22   HPI  Chief Complaint: OBESITY  Lindsey Bowman is here to discuss her progress with her obesity treatment plan. She is on the the Category 3 Plan and states she is following her eating plan approximately 35 % of the time. She states she is not exercising.  Interval History:  Since last office visit she has lost 11 pounds.  She is currently on Qsymia and denies any adverse effects.  She does acknowledge deviating from nutrition plan and not exercising.  She has been under a lot of stress due to some personal problems involving her household.   She reports suboptimal adherence She has been working on not skipping meals  Orexigenic Control: Denies problems with appetite and hunger signals.  Denies problems with satiety and satiation.  Denies problems with eating patterns and portion control.  Denies abnormal cravings. Denies feeling deprived or restricted.   Barriers identified: moderate to high levels of stress.   Pharmacotherapy for weight loss: She is currently taking Qsymia with adequate clinical response  and without side effects..    ASSESSMENT AND PLAN  TREATMENT PLAN FOR OBESITY:  Recommended Dietary Goals  Lindsey Bowman is currently in the action stage of change. As such, her goal is to continue weight management  plan. She has agreed to: continue to work on implementation of reduced calorie nutrition plan (RCNP)  Behavioral Intervention  We discussed the following Behavioral Modification Strategies today: increasing lean protein intake, decreasing simple carbohydrates , increasing vegetables, increasing lower glycemic fruits, increasing fiber rich foods, avoiding skipping meals, increasing water intake, work on managing stress, creating time for self-care and relaxation measures, and continue to work on implementation of reduced calorie nutritional plan.  Additional resources provided today: None  Recommended Physical Activity Goals  Lindsey Bowman has been advised to work up to 150 minutes of moderate intensity aerobic activity a week and strengthening exercises 2-3 times per week for cardiovascular health, weight loss maintenance and preservation of muscle mass.   She has agreed to :  Think about ways to increase daily physical activity and overcoming barriers to exercise  Pharmacotherapy We discussed various medication options to help Lindsey Bowman with her weight loss efforts and we both agreed to : continue current anti-obesity medication regimen  ASSOCIATED CONDITIONS ADDRESSED TODAY  Prediabetes Assessment & Plan: Most recent A1c is  Lab Results  Component Value Date   HGBA1C 6.3 (H) 03/04/2023   HGBA1C 6.4 (H) 11/04/2022    Patient aware of disease state and risk of progression. This may contribute to abnormal cravings, fatigue and diabetic complications without having diabetes.   We have discussed treatment options which include: losing 7 to 10% of body weight, increasing physical activity to a goal of 150 minutes a week at moderate intensity.  Advised to  maintain a diet low on simple and processed carbohydrates.  She had been on metformin in the past for pharmacoprophylaxis but did not tolerate medication due to perceived symptoms of hypoglycemia.  It was likely secondary to hyperinsulinemia.   We will consider rechallenging as she continues to lose weight and we should see an improvement in insulin levels.    Class 3 severe obesity due to excess calories with serious comorbidity and body mass index (BMI) of 50.0 to 59.9 in adult [facility]) Assessment & Plan: Lindsey Bowman has lost 33 pounds or 10% of total body weight since starting Qsymia.  She is having an adequate clinical response at current dose and therefore we will continue at this dose for an additional 6-8 weeks. She is not experiencing any side effects.  She is also not sexually active and low risk for pregnancy.  She was again reminded of medication teratogenicity and that she would need reliable means of contraception if she becomes sexually active.  She is dealing with household stress.  And will be working on getting back on track.  Orders: -     Phentermine-Topiramate; Take 1 capsule by mouth daily.  Dispense: 30 capsule; Refill: 0  Abnormal food appetite Assessment & Plan: Improved.  She had increased orixegenic signaling, impaired satiety and inhibitory control. This is secondary to an abnormal energy regulation system and pathological neurohormonal pathways characteristic of excess adiposity.  In addition to nutritional and behavioral strategies she benefits from ongoing pharmacotherapy.  She will continue Qsymia at current dose    Stress at home Assessment & Plan: Patient counseled on the importance of self-care.  She has access to telehealth for behavioral health if needed.  I recommend that she begin to exercise again and work on implementing nutritional plan.  Also to stay focused on both scale and non scale accomplishments.     PHYSICAL EXAM:  Blood pressure 116/81, pulse 84, temperature 98.1 F (36.7 C), height 5\' 8"  (1.727 m), weight (!) 333 lb (151 kg), last menstrual period 05/10/2023, SpO2 99%, unknown if currently breastfeeding. Body mass index is 50.63 kg/m.  General: She is overweight, cooperative,  alert, well developed, and in no acute distress. PSYCH: Has normal mood, affect and thought process.   HEENT: EOMI, sclerae are anicteric. Lungs: Normal breathing effort, no conversational dyspnea. Extremities: No edema.  Neurologic: No gross sensory or motor deficits. No tremors or fasciculations noted.    DIAGNOSTIC DATA REVIEWED:  BMET    Component Value Date/Time   NA 137 11/04/2022 0829   K 4.8 11/04/2022 0829   CL 101 11/04/2022 0829   CO2 25 11/04/2022 0829   GLUCOSE 99 11/04/2022 0829   GLUCOSE 91 05/11/2009 0257   BUN 9 11/04/2022 0829   CREATININE 0.79 11/04/2022 0829   CALCIUM 9.5 11/04/2022 0829   GFRNONAA >60 05/11/2009 0257   GFRAA  05/11/2009 0257    >60        The eGFR has been calculated using the MDRD equation. This calculation has not been validated in all clinical situations. eGFR's persistently <60 mL/min signify possible Chronic Kidney Disease.   Lab Results  Component Value Date   HGBA1C 6.3 (H) 03/04/2023   HGBA1C 6.4 (H) 11/04/2022   Lab Results  Component Value Date   INSULIN 43.7 (H) 03/04/2023   INSULIN 57.0 (H) 11/04/2022   Lab Results  Component Value Date   TSH 1.760 11/04/2022   CBC    Component Value Date/Time   WBC 5.7 11/04/2022 0829  WBC 9.2 05/12/2009 0620   RBC 4.40 11/04/2022 0829   RBC 3.35 (L) 05/12/2009 0620   HGB 12.3 11/04/2022 0829   HCT 39.1 11/04/2022 0829   PLT 440 11/04/2022 0829   MCV 89 11/04/2022 0829   MCH 28.0 11/04/2022 0829   MCHC 31.5 11/04/2022 0829   MCHC 34.3 05/12/2009 0620   RDW 12.3 11/04/2022 0829   Iron Studies No results found for: "IRON", "TIBC", "FERRITIN", "IRONPCTSAT" Lipid Panel     Component Value Date/Time   CHOL 154 11/04/2022 0829   TRIG 78 11/04/2022 0829   HDL 54 11/04/2022 0829   CHOLHDL 2.5 Ratio 09/02/2007 2043   VLDL 20 09/02/2007 2043   LDLCALC 85 11/04/2022 0829   Hepatic Function Panel     Component Value Date/Time   PROT 6.8 11/04/2022 0829   ALBUMIN 3.9  11/04/2022 0829   AST 13 11/04/2022 0829   ALT 17 11/04/2022 0829   ALKPHOS 48 11/04/2022 0829   BILITOT 0.3 11/04/2022 0829   BILIDIR <0.1 mg/dL 33/29/5188 4166   IBILI NOT CALC mg/dL 03/06/1600 0932      Component Value Date/Time   TSH 1.760 11/04/2022 0829   Nutritional Lab Results  Component Value Date   VD25OH 30.5 03/04/2023   VD25OH 22.5 (L) 11/04/2022     Return in about 4 weeks (around 06/09/2023) for Week of Sept 30th - 4 weeks - with Dr. Rikki Spearing.Marland Kitchen She was informed of the importance of frequent follow up visits to maximize her success with intensive lifestyle modifications for her multiple health conditions.   ATTESTASTION STATEMENTS:  Reviewed by clinician on day of visit: allergies, medications, problem list, medical history, surgical history, family history, social history, and previous encounter notes.     Worthy Rancher, MD

## 2023-05-12 NOTE — Assessment & Plan Note (Signed)
Improved.  She had increased orixegenic signaling, impaired satiety and inhibitory control. This is secondary to an abnormal energy regulation system and pathological neurohormonal pathways characteristic of excess adiposity.  In addition to nutritional and behavioral strategies she benefits from ongoing pharmacotherapy.  She will continue Qsymia at current dose

## 2023-05-12 NOTE — Assessment & Plan Note (Signed)
Most recent A1c is  Lab Results  Component Value Date   HGBA1C 6.3 (H) 03/04/2023   HGBA1C 6.4 (H) 11/04/2022    Patient aware of disease state and risk of progression. This may contribute to abnormal cravings, fatigue and diabetic complications without having diabetes.   We have discussed treatment options which include: losing 7 to 10% of body weight, increasing physical activity to a goal of 150 minutes a week at moderate intensity.  Advised to maintain a diet low on simple and processed carbohydrates.  She had been on metformin in the past for pharmacoprophylaxis but did not tolerate medication due to perceived symptoms of hypoglycemia.  It was likely secondary to hyperinsulinemia.  We will consider rechallenging as she continues to lose weight and we should see an improvement in insulin levels.

## 2023-06-17 ENCOUNTER — Ambulatory Visit (INDEPENDENT_AMBULATORY_CARE_PROVIDER_SITE_OTHER): Payer: Managed Care, Other (non HMO) | Admitting: Internal Medicine

## 2023-06-17 ENCOUNTER — Encounter (INDEPENDENT_AMBULATORY_CARE_PROVIDER_SITE_OTHER): Payer: Self-pay | Admitting: Internal Medicine

## 2023-06-17 VITALS — BP 120/86 | HR 96 | Temp 98.3°F | Ht 68.0 in | Wt 330.0 lb

## 2023-06-17 DIAGNOSIS — E559 Vitamin D deficiency, unspecified: Secondary | ICD-10-CM

## 2023-06-17 DIAGNOSIS — F439 Reaction to severe stress, unspecified: Secondary | ICD-10-CM

## 2023-06-17 DIAGNOSIS — E66813 Obesity, class 3: Secondary | ICD-10-CM

## 2023-06-17 DIAGNOSIS — Z6841 Body Mass Index (BMI) 40.0 and over, adult: Secondary | ICD-10-CM

## 2023-06-17 MED ORDER — PHENTERMINE-TOPIRAMATE ER 7.5-46 MG PO CP24: 1.0000 | ORAL_CAPSULE | Freq: Every day | ORAL | 0 refills | Status: DC

## 2023-06-17 MED ORDER — VITAMIN D3 50 MCG (2000 UT) PO CAPS: 2000.0000 [IU] | ORAL_CAPSULE | Freq: Every day | ORAL | Status: DC

## 2023-06-17 NOTE — Progress Notes (Signed)
Office: [phone]  /  Fax: [phone]  WEIGHT SUMMARY AND BIOMETRICS  Vitals Temp: 98.3 F (36.8 C) BP: 120/86 Pulse Rate: 96 SpO2: 97 %   Anthropometric Measurements Height: 5\' 8"  (1.727 m) Weight: (!) 330 lb (149.7 kg) BMI (Calculated): 50.19 Weight at Last Visit: 333 lb Weight Lost Since Last Visit: 3 lb Weight Gained Since Last Visit: 0 Starting Weight: 360 lb Total Weight Loss (lbs): 36 lb (16.3 kg) Peak Weight: 361 lb   Body Composition  Body Fat %: 54.1 % Fat Mass (lbs): 178.6 lbs Muscle Mass (lbs): 144 lbs Total Body Water (lbs): 108.6 lbs Visceral Fat Rating : 20    No data recorded Today's Visit #: 9  Starting Date: 11/04/22   HPI  Chief Complaint: OBESITY  Lindsey Bowman is here to discuss her progress with her obesity treatment plan. She is on the the Category 3 Plan and states she is following her eating plan approximately 10-15 % of the time. She states she is not exercising,no minutes  per week.  Interval History:  Since last office visit she has lost 3 pounds. She reports variable adherence to reduced calorie nutritional plan She reports a lapse due to work and family stress.  She has not been meal prepping and has difficulty sleeping.  She has been eating more processed foods recently.  Orexigenic Control: Denies problems with appetite and hunger signals.  Denies problems with satiety and satiation.  Denies problems with eating patterns and portion control.  Denies abnormal cravings. Denies feeling deprived or restricted.   Barriers identified: strong hunger signals and impaired satiety / inhibitory control, having difficulty with meal prep and planning, difficulty implementing reduced calorie nutrition plan, inadequate sleep, and moderate to high levels of stress.   Pharmacotherapy for weight loss: She is currently taking Qsymia with adequate clinical response  and without side effects..    ASSESSMENT AND PLAN  TREATMENT PLAN FOR  OBESITY:  Recommended Dietary Goals  Lindsey Bowman is currently in the action stage of change. As such, her goal is to continue weight management plan. She has agreed to: continue to work on implementation of reduced calorie nutrition plan (RCNP)  Behavioral Intervention  We discussed the following Behavioral Modification Strategies today: continue to work on maintaining a reduced calorie state, getting the recommended amount of protein, incorporating whole foods, making healthy choices, staying well hydrated and practicing mindfulness when eating..  Additional resources provided today: None  Recommended Physical Activity Goals  [name] has been advised to work up to 150 minutes of moderate intensity aerobic activity a week and strengthening exercises 2-3 times per week for cardiovascular health, weight loss maintenance and preservation of muscle mass.   She has agreed to :  Think about enjoyable ways to increase daily physical activity and overcoming barriers to exercise and Increase physical activity in their day and reduce sedentary time (increase NEAT).  Pharmacotherapy We discussed various medication options to help [name] with her weight loss efforts and we both agreed to : continue current anti-obesity medication regimen  ASSOCIATED CONDITIONS ADDRESSED TODAY  Stress at home Assessment & Plan: Patient counseled on the importance of self-care.  She has access to telehealth for behavioral health if needed.  I recommend that she begin to exercise again and work on implementing nutritional plan.  Also to stay focused on both scale and non scale accomplishments.  We counseled on avoiding all or none thought process.   Class 3 severe obesity due to excess calories with serious comorbidity  and body mass index (BMI) of 50.0 to 59.9 in adult [facility]) Assessment & Plan: Lindsey Bowman has lost 36 pounds or 11 % of total body weight since starting Qsymia.  She is having an adequate clinical response at  current dose and therefore we will continue at this dose for an additional 6-8 weeks. She is not experiencing any side effects.  She is also not sexually active and low risk for pregnancy.  She was again reminded of medication teratogenicity and that she would need reliable means of contraception if she becomes sexually active.  She is dealing with household stress.  And will be working on getting back on track.  We recommended reaching out to her insurance to see if they cover counseling she will benefit from this.  Also increasing physical activity  Orders: -     Phentermine-Topiramate; Take 1 capsule by mouth daily.  Dispense: 30 capsule; Refill: 0  Vitamin D deficiency Assessment & Plan: Most recent vitamin D levels  Lab Results  Component Value Date   VD25OH 30.5 03/04/2023   VD25OH 22.5 (L) 11/04/2022     Deficiency state associated with adiposity and may result in leptin resistance, weight gain and fatigue. Currently on vitamin D supplementation without any adverse effects.  Plan: She has completed a total of 6 months of high-dose vitamin D.  Patient will transition to over-the-counter vitamin D3 2000 international units daily.    Other orders -     Vitamin D3; Take 1 capsule (2,000 Units total) by mouth daily.    PHYSICAL EXAM:  Blood pressure 120/86, pulse 96, temperature 98.3 F (36.8 C), height 5\' 8"  (1.727 m), weight (!) 330 lb (149.7 kg), last menstrual period 06/08/2023, SpO2 97%, unknown if currently breastfeeding. Body mass index is 50.18 kg/m.  General: She is overweight, cooperative, alert, well developed, and in no acute distress. PSYCH: Has normal mood, affect and thought process.   HEENT: EOMI, sclerae are anicteric. Lungs: Normal breathing effort, no conversational dyspnea. Extremities: No edema.  Neurologic: No gross sensory or motor deficits. No tremors or fasciculations noted.    DIAGNOSTIC DATA REVIEWED:  BMET    Component Value Date/Time   NA  137 11/04/2022 0829   K 4.8 11/04/2022 0829   CL 101 11/04/2022 0829   CO2 25 11/04/2022 0829   GLUCOSE 99 11/04/2022 0829   GLUCOSE 91 05/11/2009 0257   BUN 9 11/04/2022 0829   CREATININE 0.79 11/04/2022 0829   CALCIUM 9.5 11/04/2022 0829   GFRNONAA >60 05/11/2009 0257   GFRAA  05/11/2009 0257    >60        The eGFR has been calculated using the MDRD equation. This calculation has not been validated in all clinical situations. eGFR's persistently <60 mL/min signify possible Chronic Kidney Disease.   Lab Results  Component Value Date   HGBA1C 6.3 (H) 03/04/2023   HGBA1C 6.4 (H) 11/04/2022   Lab Results  Component Value Date   INSULIN 43.7 (H) 03/04/2023   INSULIN 57.0 (H) 11/04/2022   Lab Results  Component Value Date   TSH 1.760 11/04/2022   CBC    Component Value Date/Time   WBC 5.7 11/04/2022 0829   WBC 9.2 05/12/2009 0620   RBC 4.40 11/04/2022 0829   RBC 3.35 (L) 05/12/2009 0620   HGB 12.3 11/04/2022 0829   HCT 39.1 11/04/2022 0829   PLT 440 11/04/2022 0829   MCV 89 11/04/2022 0829   MCH 28.0 11/04/2022 0829   MCHC 31.5 11/04/2022 0829  MCHC 34.3 05/12/2009 0620   RDW 12.3 11/04/2022 0829   Iron Studies No results found for: "IRON", "TIBC", "FERRITIN", "IRONPCTSAT" Lipid Panel     Component Value Date/Time   CHOL 154 11/04/2022 0829   TRIG 78 11/04/2022 0829   HDL 54 11/04/2022 0829   CHOLHDL 2.5 Ratio 09/02/2007 2043   VLDL 20 09/02/2007 2043   LDLCALC 85 11/04/2022 0829   Hepatic Function Panel     Component Value Date/Time   PROT 6.8 11/04/2022 0829   ALBUMIN 3.9 11/04/2022 0829   AST 13 11/04/2022 0829   ALT 17 11/04/2022 0829   ALKPHOS 48 11/04/2022 0829   BILITOT 0.3 11/04/2022 0829   BILIDIR <0.1 mg/dL 16/06/9603 5409   IBILI NOT CALC mg/dL 81/19/1478 2956      Component Value Date/Time   TSH 1.760 11/04/2022 0829   Nutritional Lab Results  Component Value Date   VD25OH 30.5 03/04/2023   VD25OH 22.5 (L) 11/04/2022      Return in about 4 weeks (around 07/15/2023) for For Weight Mangement with Dr. Rikki Spearing.Marland Kitchen She was informed of the importance of frequent follow up visits to maximize her success with intensive lifestyle modifications for her multiple health conditions.   ATTESTASTION STATEMENTS:  Reviewed by clinician on day of visit: allergies, medications, problem list, medical history, surgical history, family history, social history, and previous encounter notes.     Worthy Rancher, MD

## 2023-06-18 NOTE — Assessment & Plan Note (Signed)
Most recent vitamin D levels  Lab Results  Component Value Date   VD25OH 30.5 03/04/2023   VD25OH 22.5 (L) 11/04/2022     Deficiency state associated with adiposity and may result in leptin resistance, weight gain and fatigue. Currently on vitamin D supplementation without any adverse effects.  Plan: She has completed a total of 6 months of high-dose vitamin D.  Patient will transition to over-the-counter vitamin D3 2000 international units daily.

## 2023-06-18 NOTE — Assessment & Plan Note (Signed)
Lindsey Bowman has lost 36 pounds or 11 % of total body weight since starting Qsymia.  She is having an adequate clinical response at current dose and therefore we will continue at this dose for an additional 6-8 weeks. She is not experiencing any side effects.  She is also not sexually active and low risk for pregnancy.  She was again reminded of medication teratogenicity and that she would need reliable means of contraception if she becomes sexually active.  She is dealing with household stress.  And will be working on getting back on track.  We recommended reaching out to her insurance to see if they cover counseling she will benefit from this.  Also increasing physical activity

## 2023-06-18 NOTE — Assessment & Plan Note (Signed)
Patient counseled on the importance of self-care.  She has access to telehealth for behavioral health if needed.  I recommend that she begin to exercise again and work on implementing nutritional plan.  Also to stay focused on both scale and non scale accomplishments.  We counseled on avoiding all or none thought process.

## 2023-07-04 ENCOUNTER — Other Ambulatory Visit (INDEPENDENT_AMBULATORY_CARE_PROVIDER_SITE_OTHER): Payer: Self-pay | Admitting: Internal Medicine

## 2023-07-04 DIAGNOSIS — E66813 Obesity, class 3: Secondary | ICD-10-CM

## 2023-07-05 ENCOUNTER — Encounter (INDEPENDENT_AMBULATORY_CARE_PROVIDER_SITE_OTHER): Payer: Self-pay | Admitting: Internal Medicine

## 2023-07-15 ENCOUNTER — Encounter (INDEPENDENT_AMBULATORY_CARE_PROVIDER_SITE_OTHER): Payer: Self-pay | Admitting: Internal Medicine

## 2023-07-15 ENCOUNTER — Ambulatory Visit (INDEPENDENT_AMBULATORY_CARE_PROVIDER_SITE_OTHER): Payer: Managed Care, Other (non HMO) | Admitting: Internal Medicine

## 2023-07-15 VITALS — BP 115/74 | HR 86 | Temp 98.2°F | Ht 68.0 in | Wt 326.0 lb

## 2023-07-15 DIAGNOSIS — Z6841 Body Mass Index (BMI) 40.0 and over, adult: Secondary | ICD-10-CM

## 2023-07-15 DIAGNOSIS — R638 Other symptoms and signs concerning food and fluid intake: Secondary | ICD-10-CM | POA: Diagnosis not present

## 2023-07-15 DIAGNOSIS — E669 Obesity, unspecified: Secondary | ICD-10-CM | POA: Diagnosis not present

## 2023-07-15 DIAGNOSIS — R7303 Prediabetes: Secondary | ICD-10-CM | POA: Diagnosis not present

## 2023-07-15 MED ORDER — PHENTERMINE-TOPIRAMATE ER 7.5-46 MG PO CP24: 1.0000 | ORAL_CAPSULE | Freq: Every day | ORAL | 1 refills | Status: DC

## 2023-07-15 NOTE — Progress Notes (Signed)
Office: [phone]  /  Fax: [phone]  WEIGHT SUMMARY AND BIOMETRICS  Vitals Temp: 98.2 F (36.8 C) BP: 115/74 Pulse Rate: 86 SpO2: 100 %   Anthropometric Measurements Height: 5\' 8"  (1.727 m) Weight: (!) 326 lb (147.9 kg) BMI (Calculated): 49.58 Weight at Last Visit: 330 lb Weight Lost Since Last Visit: 4 lb Weight Gained Since Last Visit: 0 lb Starting Weight: 360 lb Total Weight Loss (lbs): 40 lb (18.1 kg) Peak Weight: 361 lb   Body Composition  Body Fat %: 54.8 % Fat Mass (lbs): 178.8 lbs Muscle Mass (lbs): 140 lbs Total Body Water (lbs): 111 lbs Visceral Fat Rating : 20    No data recorded Today's Visit #: 10  Starting Date: 11/04/22   HPI  Chief Complaint: OBESITY  Lindsey Bowman is here to discuss her progress with her obesity treatment plan. She is on the the Category 3 Plan and states she is following her eating plan approximately 15 % of the time. She states she is walking more exercising.  Interval History:   Discussed the use of AI scribe software for clinical note transcription with the patient, who gave verbal consent to proceed.  History of Present Illness   Lindsey Bowman, a patient with a history of obesity, presents for a weight management consultation. Despite ongoing stressors related to a challenging home situation and work, she has managed to lose four pounds since the last visit. However, she expresses feelings of being on a plateau, citing a lack of significant weight change since late October.  She acknowledges some dietary inconsistencies, including skipping meals due to a combination of lack of hunger and busyness. She also reports a lack of formal exercise outside of work, attributing this to mental fatigue and a desire to spend free time with her son.  She is currently on a regimen of phentermine-topiramate for weight management, which she reports has helped to reduce her food consumption by promoting a sense of fullness. Despite the  challenges, she remains committed to her weight loss journey, expressing optimism about future progress once her living situation improves.     Barriers identified: lack of time for self-care, multiple competing priorities, having difficulty with meal prep and planning, low volume of physical activity at present , work schedule, and moderate to high levels of stress.   Pharmacotherapy for weight loss: She is currently taking Qsymia with adequate clinical response  and without side effects..    ASSESSMENT AND PLAN  TREATMENT PLAN FOR OBESITY:  Recommended Dietary Goals  Lindsey Bowman is currently in the action stage of change. As such, her goal is to continue weight management plan. She has agreed to: continue current plan  Behavioral Intervention  We discussed the following Behavioral Modification Strategies today: continue to work on maintaining a reduced calorie state, getting the recommended amount of protein, incorporating whole foods, making healthy choices, staying well hydrated and practicing mindfulness when eating..  Additional resources provided today: None  Recommended Physical Activity Goals  Lindsey Bowman has been advised to work up to 150 minutes of moderate intensity aerobic activity a week and strengthening exercises 2-3 times per week for cardiovascular health, weight loss maintenance and preservation of muscle mass.   She has agreed to :  Think about enjoyable ways to increase daily physical activity and overcoming barriers to exercise and Increase physical activity in their day and reduce sedentary time (increase NEAT).  Pharmacotherapy We discussed various medication options to help Lindsey Bowman with her weight loss efforts and we both  agreed to : continue with nutritional and behavioral strategies  ASSOCIATED CONDITIONS ADDRESSED TODAY  Assessment and Plan    Obesity  / abnormal food appetite She presents for weight management, having lost 40 pounds since April, now weighing 326  pounds, down from 366 pounds, with a steady weight loss of about a pound per week. Significant stress from her living situation and financial matters may be affecting her eating habits and weight management. She is currently on phentermine/topiramate, which aids in feeling full. However, she skips meals due to lack of hunger and being busy, potentially causing a plateau by slowing metabolism. Despite being active at work, she does not engage in physical exercise outside of work due to fatigue and time constraints. We discussed the importance of regular balanced meals to prevent metabolic slowdown and the benefits of physical activity outside of work, such as walking or dancing, for stress and fatigue. We will continue phentermine/topiramate at the current dose and refill the prescription. We encourage regular balanced meals and physical activity outside of work. A follow-up appointment is scheduled in 4-5 weeks, with the next appointment prebooked to ensure continuity of care.     Prediabetes Her last hemoglobin A1c was 6.3.  She has been on metformin in the past but discontinued medication because of perceived hypoglycemia this was likely secondary to reduced gluconeogenesis in the presence of hyperinsulinemia.  I would like to check her hemoglobin A1c at the next visit.  Her insurance does not cover GLP-1 therapy.  She was also reminded of the importance of maintaining a diet low in simple and added sugars and saturated fats.  She will continue to work with nutrition and behavioral strategies for weight loss     PHYSICAL EXAM:  Blood pressure 115/74, pulse 86, temperature 98.2 F (36.8 C), height 5\' 8"  (1.727 m), weight (!) 326 lb (147.9 kg), last menstrual period 07/09/2023, SpO2 100%, unknown if currently breastfeeding. Body mass index is 49.57 kg/m.  General: She is overweight, cooperative, alert, well developed, and in no acute distress. PSYCH: Has normal mood, affect and thought process.    HEENT: EOMI, sclerae are anicteric. Lungs: Normal breathing effort, no conversational dyspnea. Extremities: No edema.  Neurologic: No gross sensory or motor deficits. No tremors or fasciculations noted.    DIAGNOSTIC DATA REVIEWED:  BMET    Component Value Date/Time   NA 137 11/04/2022 0829   K 4.8 11/04/2022 0829   CL 101 11/04/2022 0829   CO2 25 11/04/2022 0829   GLUCOSE 99 11/04/2022 0829   GLUCOSE 91 05/11/2009 0257   BUN 9 11/04/2022 0829   CREATININE 0.79 11/04/2022 0829   CALCIUM 9.5 11/04/2022 0829   GFRNONAA >60 05/11/2009 0257   GFRAA  05/11/2009 0257    >60        The eGFR has been calculated using the MDRD equation. This calculation has not been validated in all clinical situations. eGFR's persistently <60 mL/min signify possible Chronic Kidney Disease.   Lab Results  Component Value Date   HGBA1C 6.3 (H) 03/04/2023   HGBA1C 6.4 (H) 11/04/2022   Lab Results  Component Value Date   INSULIN 43.7 (H) 03/04/2023   INSULIN 57.0 (H) 11/04/2022   Lab Results  Component Value Date   TSH 1.760 11/04/2022   CBC    Component Value Date/Time   WBC 5.7 11/04/2022 0829   WBC 9.2 05/12/2009 0620   RBC 4.40 11/04/2022 0829   RBC 3.35 (L) 05/12/2009 0620   HGB 12.3 11/04/2022  0829   HCT 39.1 11/04/2022 0829   PLT 440 11/04/2022 0829   MCV 89 11/04/2022 0829   MCH 28.0 11/04/2022 0829   MCHC 31.5 11/04/2022 0829   MCHC 34.3 05/12/2009 0620   RDW 12.3 11/04/2022 0829   Iron Studies No results found for: "IRON", "TIBC", "FERRITIN", "IRONPCTSAT" Lipid Panel     Component Value Date/Time   CHOL 154 11/04/2022 0829   TRIG 78 11/04/2022 0829   HDL 54 11/04/2022 0829   CHOLHDL 2.5 Ratio 09/02/2007 2043   VLDL 20 09/02/2007 2043   LDLCALC 85 11/04/2022 0829   Hepatic Function Panel     Component Value Date/Time   PROT 6.8 11/04/2022 0829   ALBUMIN 3.9 11/04/2022 0829   AST 13 11/04/2022 0829   ALT 17 11/04/2022 0829   ALKPHOS 48 11/04/2022 0829    BILITOT 0.3 11/04/2022 0829   BILIDIR <0.1 mg/dL 14/78/2956 2130   IBILI NOT CALC mg/dL 86/57/8469 6295      Component Value Date/Time   TSH 1.760 11/04/2022 0829   Nutritional Lab Results  Component Value Date   VD25OH 30.5 03/04/2023   VD25OH 22.5 (L) 11/04/2022     Return in about 4 weeks (around 08/12/2023) for For Weight Mangement with Dr. Rikki Spearing.Marland Kitchen She was informed of the importance of frequent follow up visits to maximize her success with intensive lifestyle modifications for her multiple health conditions.   ATTESTASTION STATEMENTS:  Reviewed by clinician on day of visit: allergies, medications, problem list, medical history, surgical history, family history, social history, and previous encounter notes.     Worthy Rancher, MD

## 2023-08-16 ENCOUNTER — Ambulatory Visit (INDEPENDENT_AMBULATORY_CARE_PROVIDER_SITE_OTHER): Payer: Managed Care, Other (non HMO) | Admitting: Internal Medicine

## 2023-08-16 ENCOUNTER — Encounter (INDEPENDENT_AMBULATORY_CARE_PROVIDER_SITE_OTHER): Payer: Self-pay | Admitting: Internal Medicine

## 2023-08-16 VITALS — BP 130/76 | HR 100 | Temp 98.7°F | Ht 68.0 in | Wt 323.0 lb

## 2023-08-16 DIAGNOSIS — R638 Other symptoms and signs concerning food and fluid intake: Secondary | ICD-10-CM

## 2023-08-16 DIAGNOSIS — G4733 Obstructive sleep apnea (adult) (pediatric): Secondary | ICD-10-CM

## 2023-08-16 DIAGNOSIS — R7303 Prediabetes: Secondary | ICD-10-CM

## 2023-08-16 DIAGNOSIS — E66813 Obesity, class 3: Secondary | ICD-10-CM | POA: Diagnosis not present

## 2023-08-16 DIAGNOSIS — Z6841 Body Mass Index (BMI) 40.0 and over, adult: Secondary | ICD-10-CM

## 2023-08-16 MED ORDER — PHENTERMINE-TOPIRAMATE ER 11.25-69 MG PO CP24: 1.0000 | ORAL_CAPSULE | Freq: Every day | ORAL | 1 refills | Status: DC

## 2023-08-16 NOTE — Assessment & Plan Note (Signed)
Improved but experiencing a waning effect at current dose.  She has responded favorably to Qsymia medication will be increased today please refer to orders.

## 2023-08-16 NOTE — Assessment & Plan Note (Signed)
On CPAP with reported good compliance. Continue PAP therapy. Losing 15% or more of body weight may improve AHI.    

## 2023-08-16 NOTE — Assessment & Plan Note (Signed)
Most recent A1c is  Lab Results  Component Value Date   HGBA1C 6.3 (H) 03/04/2023   HGBA1C 6.4 (H) 11/04/2022    Patient aware of disease state and risk of progression. This may contribute to abnormal cravings, fatigue and diabetic complications without having diabetes.   We have discussed treatment options which include: losing 7 to 10% of body weight, increasing physical activity to a goal of 150 minutes a week at moderate intensity.  Advised to maintain a diet low on simple and processed carbohydrates.  She had been on metformin in the past for pharmacoprophylaxis but did not tolerate medication due to perceived symptoms of hypoglycemia.  She may be a candidate for GLP-1 therapy but this is cost prohibitive.

## 2023-08-16 NOTE — Assessment & Plan Note (Signed)
[name] has lost 43 since starting Qsymia.  Lindsey Bowman is noting a wearing off effect we will therefore increase medication.  Lindsey Bowman is not experiencing any side effects Lindsey Bowman is also low risk for pregnancy.  Lindsey Bowman will continue to work on implementation of reduced calorie nutrition plan.

## 2023-08-16 NOTE — Progress Notes (Signed)
Office: [phone]  /  Fax: [phone]  Weight Summary And Biometrics  Vitals Temp: 98.7 F (37.1 C) BP: 130/76 Pulse Rate: 100 SpO2: 99 %   Anthropometric Measurements Height: 5\' 8"  (1.727 m) Weight: (!) 323 lb (146.5 kg) BMI (Calculated): 49.12 Weight at Last Visit: 326 lb Weight Lost Since Last Visit: 3 lbs Weight Gained Since Last Visit: 0 lb Starting Weight: 360 lb Total Weight Loss (lbs): 43 lb (19.5 kg) Peak Weight: 361 lb   Body Composition  Body Fat %: 53.7 % Fat Mass (lbs): 173.6 lbs Muscle Mass (lbs): 142.2 lbs Total Body Water (lbs): 108.4 lbs Visceral Fat Rating : 19    No data recorded Today's Visit #: 11  Starting Date: 11/04/22   Subjective   Chief Complaint: Obesity  Interval History:   Discussed the use of AI scribe software for clinical note transcription with the patient, who gave verbal consent to proceed.  History of Present Illness   The patient, with a history of obesity, prediabetes, and abnormal food appetite, presents for a medical weight management consultation. She is currently on Qsymia 7.5 mg 46 mg daily and has lost weight since the last visit. The patient reports no side effects from the medication and feels somewhat full after eating, indicating a degree of appetite control. However, she expresses a desire for an increased dosage to further aid in weight loss.  The patient has recently moved to a new residence, which she describes as a positive change, providing her with more mental space to focus on her health. She has started grocery shopping for healthier food options and is considering incorporating exercise into her routine. However, she reports a lack of energy, which she believes should be addressed by the phentermine component of her medication.  The patient is planning to focus on meal prepping and following a balanced diet plan, which includes reducing processed foods and increasing whole foods, lean proteins,  fruits, and vegetables. She expresses a desire to lose weight gradually and sustainably, without feeling overly restricted or needing to count calories. She also mentions the possibility of joining a gym in the future, but for now, she plans to engage in free activities like walking.  The patient acknowledges that she has lost a significant amount of weight since starting the medication, even during a stressful period in her life. She expresses a desire to continue this positive trend and is optimistic about the potential results of an increased dosage of her medication. She also mentions a potential romantic interest, which seems to motivate her weight loss journey further.      Orexigenic Control:  Reports problems with appetite and hunger signals.  Reports problems with satiety and satiation.  Denies problems with eating patterns and portion control.  Denies abnormal cravings. Denies feeling deprived or restricted.   Barriers identified: multiple competing priorities, low volume of physical activity at present , work schedule, and moderate to high levels of stress.   Pharmacotherapy for weight loss: She is currently taking Qsymia with adequate clinical response  and without side effects..   Assessment and Plan   Treatment Plan For Obesity:  Recommended Dietary Goals  Brook is currently in the action stage of change. As such, her goal is to continue weight management plan. She has agreed to: continue current plan  Behavioral Intervention  We discussed the following Behavioral Modification Strategies today: continue to work on maintaining a reduced calorie state, getting the recommended amount of protein, incorporating whole foods, making  healthy choices, staying well hydrated and practicing mindfulness when eating..  Additional resources provided today: None  Recommended Physical Activity Goals  Tereka has been advised to work up to 150 minutes of moderate intensity aerobic  activity a week and strengthening exercises 2-3 times per week for cardiovascular health, weight loss maintenance and preservation of muscle mass.   She has agreed to :  Think about enjoyable ways to increase daily physical activity and overcoming barriers to exercise and Increase physical activity in their day and reduce sedentary time (increase NEAT).  Consider beginning to exercise at [facility] well  Pharmacotherapy  We discussed various medication options to help Mabel with her weight loss efforts and we both agreed to : increase Qsymia to 11.25 mg - 69 mg once daily.  Associated Conditions Addressed Today  OSA (obstructive sleep apnea) Assessment & Plan: On CPAP with reported good compliance. Continue PAP therapy. Losing 15% or more of body weight may improve AHI.      Class 3 severe obesity due to excess calories with serious comorbidity and body mass index (BMI) of 50.0 to 59.9 in adult [facility]) Assessment & Plan: Muskan has lost 43 since starting Qsymia.  She is noting a wearing off effect we will therefore increase medication.  She is not experiencing any side effects she is also low risk for pregnancy.  She will continue to work on implementation of reduced calorie nutrition plan.   Orders: -     Phentermine-Topiramate; Take 1 tablet by mouth daily.  Dispense: 30 capsule; Refill: 1  Prediabetes Assessment & Plan: Most recent A1c is  Lab Results  Component Value Date   HGBA1C 6.3 (H) 03/04/2023   HGBA1C 6.4 (H) 11/04/2022    Patient aware of disease state and risk of progression. This may contribute to abnormal cravings, fatigue and diabetic complications without having diabetes.   We have discussed treatment options which include: losing 7 to 10% of body weight, increasing physical activity to a goal of 150 minutes a week at moderate intensity.  Advised to maintain a diet low on simple and processed carbohydrates.  She had been on metformin in the past for  pharmacoprophylaxis but did not tolerate medication due to perceived symptoms of hypoglycemia.  She may be a candidate for GLP-1 therapy but this is cost prohibitive.    Abnormal food appetite Assessment & Plan: Improved but experiencing a waning effect at current dose.  She has responded favorably to Qsymia medication will be increased today please refer to orders.      Objective   Physical Exam:  Blood pressure 130/76, pulse 100, temperature 98.7 F (37.1 C), height 5\' 8"  (1.727 m), weight (!) 323 lb (146.5 kg), last menstrual period 08/08/2023, SpO2 99%, unknown if currently breastfeeding. Body mass index is 49.11 kg/m.  General: She is overweight, cooperative, alert, well developed, and in no acute distress. PSYCH: Has normal mood, affect and thought process.   HEENT: EOMI, sclerae are anicteric. Lungs: Normal breathing effort, no conversational dyspnea. Extremities: No edema.  Neurologic: No gross sensory or motor deficits. No tremors or fasciculations noted.    Diagnostic Data Reviewed:  BMET    Component Value Date/Time   NA 137 11/04/2022 0829   K 4.8 11/04/2022 0829   CL 101 11/04/2022 0829   CO2 25 11/04/2022 0829   GLUCOSE 99 11/04/2022 0829   GLUCOSE 91 05/11/2009 0257   BUN 9 11/04/2022 0829   CREATININE 0.79 11/04/2022 0829   CALCIUM 9.5 11/04/2022 0829  GFRNONAA >60 05/11/2009 0257   GFRAA  05/11/2009 0257    >60        The eGFR has been calculated using the MDRD equation. This calculation has not been validated in all clinical situations. eGFR's persistently <60 mL/min signify possible Chronic Kidney Disease.   Lab Results  Component Value Date   HGBA1C 6.3 (H) 03/04/2023   HGBA1C 6.4 (H) 11/04/2022   Lab Results  Component Value Date   INSULIN 43.7 (H) 03/04/2023   INSULIN 57.0 (H) 11/04/2022   Lab Results  Component Value Date   TSH 1.760 11/04/2022   CBC    Component Value Date/Time   WBC 5.7 11/04/2022 0829   WBC 9.2  05/12/2009 0620   RBC 4.40 11/04/2022 0829   RBC 3.35 (L) 05/12/2009 0620   HGB 12.3 11/04/2022 0829   HCT 39.1 11/04/2022 0829   PLT 440 11/04/2022 0829   MCV 89 11/04/2022 0829   MCH 28.0 11/04/2022 0829   MCHC 31.5 11/04/2022 0829   MCHC 34.3 05/12/2009 0620   RDW 12.3 11/04/2022 0829   Iron Studies No results found for: "IRON", "TIBC", "FERRITIN", "IRONPCTSAT" Lipid Panel     Component Value Date/Time   CHOL 154 11/04/2022 0829   TRIG 78 11/04/2022 0829   HDL 54 11/04/2022 0829   CHOLHDL 2.5 Ratio 09/02/2007 2043   VLDL 20 09/02/2007 2043   LDLCALC 85 11/04/2022 0829   Hepatic Function Panel     Component Value Date/Time   PROT 6.8 11/04/2022 0829   ALBUMIN 3.9 11/04/2022 0829   AST 13 11/04/2022 0829   ALT 17 11/04/2022 0829   ALKPHOS 48 11/04/2022 0829   BILITOT 0.3 11/04/2022 0829   BILIDIR <0.1 mg/dL 47/42/5956 3875   IBILI NOT CALC mg/dL 64/33/2951 8841      Component Value Date/Time   TSH 1.760 11/04/2022 0829   Nutritional Lab Results  Component Value Date   VD25OH 30.5 03/04/2023   VD25OH 22.5 (L) 11/04/2022    Follow-Up   Return in about 4 weeks (around 09/13/2023) for For Weight Mangement with Dr. Rikki Spearing.Marland Kitchen She was informed of the importance of frequent follow up visits to maximize her success with intensive lifestyle modifications for her multiple health conditions.  Attestation Statement   Reviewed by clinician on day of visit: allergies, medications, problem list, medical history, surgical history, family history, social history, and previous encounter notes.     Worthy Rancher, MD

## 2023-09-13 ENCOUNTER — Ambulatory Visit (INDEPENDENT_AMBULATORY_CARE_PROVIDER_SITE_OTHER): Payer: Managed Care, Other (non HMO) | Admitting: Internal Medicine

## 2023-09-13 ENCOUNTER — Encounter (INDEPENDENT_AMBULATORY_CARE_PROVIDER_SITE_OTHER): Payer: Self-pay | Admitting: Internal Medicine

## 2023-09-13 VITALS — BP 134/84 | HR 76 | Temp 97.9°F | Ht 68.0 in | Wt 317.0 lb

## 2023-09-13 DIAGNOSIS — E66813 Obesity, class 3: Secondary | ICD-10-CM | POA: Diagnosis not present

## 2023-09-13 DIAGNOSIS — Z6841 Body Mass Index (BMI) 40.0 and over, adult: Secondary | ICD-10-CM

## 2023-09-13 DIAGNOSIS — G4733 Obstructive sleep apnea (adult) (pediatric): Secondary | ICD-10-CM | POA: Diagnosis not present

## 2023-09-13 DIAGNOSIS — R7303 Prediabetes: Secondary | ICD-10-CM

## 2023-09-13 DIAGNOSIS — R638 Other symptoms and signs concerning food and fluid intake: Secondary | ICD-10-CM | POA: Diagnosis not present

## 2023-09-13 MED ORDER — PHENTERMINE-TOPIRAMATE ER 11.25-69 MG PO CP24: 1.0000 | ORAL_CAPSULE | Freq: Every day | ORAL | 1 refills | Status: DC

## 2023-09-13 NOTE — Assessment & Plan Note (Signed)
 Patient has lost 49 pounds on medically supervised weight management plan.  She is currently on Qsymia for pharmacotherapy and tolerating medication well without any side effects.  See obesity treatment plan

## 2023-09-13 NOTE — Assessment & Plan Note (Addendum)
 On CPAP with reported good compliance. Continue PAP therapy. Losing 15% or more of body weight may improve AHI.  Continue with medically supervised weight management plan

## 2023-09-13 NOTE — Assessment & Plan Note (Signed)
 Most recent A1c is  Lab Results  Component Value Date   HGBA1C 6.3 (H) 03/04/2023   HGBA1C 6.4 (H) 11/04/2022    Patient aware of disease state and risk of progression. This may contribute to abnormal cravings, fatigue and diabetic complications without having diabetes.   We have discussed treatment options which include: losing 7 to 10% of body weight, increasing physical activity to a goal of 150 minutes a week at moderate intensity.  Advised to maintain a diet low on simple and processed carbohydrates.  She had been on metformin  in the past for pharmacoprophylaxis but did not tolerate medication due to perceived symptoms of hypoglycemia.  She may be a candidate for GLP-1 therapy but this is cost prohibitive.  We will check fasting insulin , blood sugar and hemoglobin A1c at the next office visit.  I will like to rechallenge her with metformin  if her A1c's are still in the 6 range.  Medication may also aid her in her weight loss journey.

## 2023-09-13 NOTE — Assessment & Plan Note (Signed)
 Improved on Qsymia .  She has increased orexigenic signaling, impaired satiety and inhibitory control. This is secondary to an abnormal energy regulation system and pathological neurohormonal pathways characteristic of excess adiposity.  In addition to nutritional and behavioral strategies she benefits from ongoing pharmacotherapy with Qsymia .  Continue current dose.  No side effects reported

## 2023-09-13 NOTE — Progress Notes (Signed)
 Office: [phone]  /  Fax: [phone]  Weight Summary And Biometrics  Vitals Temp: 97.9 F (36.6 C) BP: 134/84 Pulse Rate: 76 SpO2: 96 %   Anthropometric Measurements Height: 5' 8 (1.727 m) Weight: (!) 317 lb (143.8 kg) BMI (Calculated): 48.21 Weight at Last Visit: 323 lb Weight Lost Since Last Visit: 0 lb Weight Gained Since Last Visit: 6 lb Starting Weight: 360 lb Total Weight Loss (lbs): 49 lb (22.2 kg) Peak Weight: 361 lb   Body Composition  Body Fat %: 52.7 % Fat Mass (lbs): 167.2 lbs Muscle Mass (lbs): 142.4 lbs Total Body Water (lbs): 103.2 lbs Visceral Fat Rating : 18    No data recorded Today's Visit #: 12  Starting Date: 11/04/22   Subjective   Chief Complaint: Obesity  Lindsey Bowman is here to discuss her progress with her obesity treatment plan. She is on the the Category 3 Plan and states she is following her eating plan approximately 80 % of the time. She states she is exercising 15 minutes 2 times per week.  Interval History:   Discussed the use of AI scribe software for clinical note transcription with the patient, who gave verbal consent to proceed.  History of Present Illness   The patient, who is under medical weight management, has been successful in losing weight, with a total loss of 49 pounds since April. She attributes this weight loss to a change in dietary habits, including reduced portion sizes and a decreased interest in greasy or fried foods. The patient reports feeling satisfied after meals and does not feel the need to overeat. She has also noticed a change in her body composition, with a decrease in body fat percentage and maintenance of muscle mass.  However, the patient has been experiencing some discomfort due to back pain, which has been a barrier to incorporating regular exercise into her routine. She is also navigating a recent move and adjusting to new financial responsibilities, which has added some stress to her  situation.  Previously, the patient was prescribed Metformin , but she discontinued the medication due to perceived side effects, including a drop in blood sugar levels that made her feel unwell. She has expressed some apprehension about restarting this medication due to concerns about side effects.  Despite these challenges, the patient remains committed to her weight loss journey and is considering future strategies to further improve her health, including the potential addition of exercise to her routine.       Orexigenic Control:  Denies problems with appetite and hunger signals.  Denies problems with satiety and satiation.  Denies problems with eating patterns and portion control.  Denies abnormal cravings. Denies feeling deprived or restricted.   Barriers identified: strong hunger signals and impaired satiety / inhibitory control, low volume of physical activity at present , and moderate to high levels of stress.   Pharmacotherapy for weight loss: She is currently taking Qsymia  with adequate clinical response  and without side effects..   Assessment and Plan   Treatment Plan For Obesity:  Recommended Dietary Goals  Lindsey Bowman is currently in the action stage of change. As such, her goal is to continue weight management plan. She has agreed to: continue current plan  Behavioral Intervention  We discussed the following Behavioral Modification Strategies today: continue to work on maintaining a reduced calorie state, getting the recommended amount of protein, incorporating whole foods, making healthy choices, staying well hydrated and practicing mindfulness when eating..  Additional resources provided today: None  Recommended  Physical Activity Goals  Lindsey Bowman has been advised to work up to 150 minutes of moderate intensity aerobic activity a week and strengthening exercises 2-3 times per week for cardiovascular health, weight loss maintenance and preservation of muscle mass.   She  has agreed to :  Think about enjoyable ways to increase daily physical activity and overcoming barriers to exercise and Increase physical activity in their day and reduce sedentary time (increase NEAT).  Pharmacotherapy  We discussed various medication options to help Lindsey Bowman with her weight loss efforts and we both agreed to : continue current anti-obesity medication regimen  Associated Conditions Addressed and Impacted by Obesity Treatment  OSA (obstructive sleep apnea) Assessment & Plan: On CPAP with reported good compliance. Continue PAP therapy. Losing 15% or more of body weight may improve AHI.  Continue with medically supervised weight management plan    Class 3 severe obesity due to excess calories with serious comorbidity and body mass index (BMI) of 50.0 to 59.9 in adult [facility]) Assessment & Plan: Patient has lost 49 pounds on medically supervised weight management plan.  She is currently on Qsymia  for pharmacotherapy and tolerating medication well without any side effects.  See obesity treatment plan  Orders: -     Phentermine -Topiramate ; Take 1 tablet by mouth daily.  Dispense: 30 capsule; Refill: 1  Prediabetes Assessment & Plan: Most recent A1c is  Lab Results  Component Value Date   HGBA1C 6.3 (H) 03/04/2023   HGBA1C 6.4 (H) 11/04/2022    Patient aware of disease state and risk of progression. This may contribute to abnormal cravings, fatigue and diabetic complications without having diabetes.   We have discussed treatment options which include: losing 7 to 10% of body weight, increasing physical activity to a goal of 150 minutes a week at moderate intensity.  Advised to maintain a diet low on simple and processed carbohydrates.  She had been on metformin  in the past for pharmacoprophylaxis but did not tolerate medication due to perceived symptoms of hypoglycemia.  She may be a candidate for GLP-1 therapy but this is cost prohibitive.  We will check fasting insulin ,  blood sugar and hemoglobin A1c at the next office visit.  I will like to rechallenge her with metformin  if her A1c's are still in the 6 range.  Medication may also aid her in her weight loss journey.    Abnormal food appetite Assessment & Plan: Improved on Qsymia .  She has increased orexigenic signaling, impaired satiety and inhibitory control. This is secondary to an abnormal energy regulation system and pathological neurohormonal pathways characteristic of excess adiposity.  In addition to nutritional and behavioral strategies she benefits from ongoing pharmacotherapy with Qsymia .  Continue current dose.  No side effects reported       Objective   Physical Exam:  Blood pressure 134/84, pulse 76, [name], height 5' 8 (1.727 m), weight (!) 317 lb (143.8 kg), last menstrual period 08/08/2023, SpO2 96%, unknown if currently breastfeeding. Body mass index is 48.2 kg/m.  General: She is overweight, cooperative, alert, well developed, and in no acute distress. PSYCH: Has normal mood, affect and thought process.   HEENT: EOMI, sclerae are anicteric. Lungs: Normal breathing effort, no conversational dyspnea. Extremities: No edema.  Neurologic: No gross sensory or motor deficits. No tremors or fasciculations noted.    Diagnostic Data Reviewed:  BMET    Component Value Date/Time   NA 137 11/04/2022 0829   K 4.8 11/04/2022 0829   CL 101 11/04/2022  0829   CO2 25 11/04/2022 0829   GLUCOSE 99 11/04/2022 0829   GLUCOSE 91 05/11/2009 0257   BUN 9 11/04/2022 0829   CREATININE 0.79 11/04/2022 0829   CALCIUM 9.5 11/04/2022 0829   GFRNONAA >60 05/11/2009 0257   GFRAA  05/11/2009 0257    >60        The eGFR has been calculated using the MDRD equation. This calculation has not been validated in all clinical situations. eGFR's persistently <60 mL/min signify possible Chronic Kidney Disease.   Lab Results  Component Value Date   HGBA1C 6.3 (H) 03/04/2023    HGBA1C 6.4 (H) 11/04/2022   Lab Results  Component Value Date   INSULIN  43.7 (H) 03/04/2023   INSULIN  57.0 (H) 11/04/2022   Lab Results  Component Value Date   TSH 1.760 11/04/2022   CBC    Component Value Date/Time   WBC 5.7 11/04/2022 0829   WBC 9.2 05/12/2009 0620   RBC 4.40 11/04/2022 0829   RBC 3.35 (L) 05/12/2009 0620   HGB 12.3 11/04/2022 0829   HCT 39.1 11/04/2022 0829   PLT 440 11/04/2022 0829   MCV 89 11/04/2022 0829   MCH 28.0 11/04/2022 0829   MCHC 31.5 11/04/2022 0829   MCHC 34.3 05/12/2009 0620   RDW 12.3 11/04/2022 0829   Iron Studies No results found for: IRON, TIBC, FERRITIN, IRONPCTSAT Lipid Panel     Component Value Date/Time   CHOL 154 11/04/2022 0829   TRIG 78 11/04/2022 0829   HDL 54 11/04/2022 0829   CHOLHDL 2.5 Ratio 09/02/2007 2043   VLDL 20 09/02/2007 2043   LDLCALC 85 11/04/2022 0829   Hepatic Function Panel     Component Value Date/Time   PROT 6.8 11/04/2022 0829   ALBUMIN 3.9 11/04/2022 0829   AST 13 11/04/2022 0829   ALT 17 11/04/2022 0829   ALKPHOS 48 11/04/2022 0829   BILITOT 0.3 11/04/2022 0829   BILIDIR <0.1 mg/dL 87/73/7991 7956   IBILI NOT CALC mg/dL 87/73/7991 7956      Component Value Date/Time   TSH 1.760 11/04/2022 0829   Nutritional Lab Results  Component Value Date   VD25OH 30.5 03/04/2023   VD25OH 22.5 (L) 11/04/2022    Follow-Up   Return for Fasting for labs.. She was informed of the importance of frequent follow up visits to maximize her success with intensive lifestyle modifications for her multiple health conditions.  Attestation Statement   Reviewed by clinician on day of visit: allergies, medications, problem list, medical history, surgical history, family history, social history, and previous encounter notes.     Lucas Parker, MD

## 2023-10-11 ENCOUNTER — Ambulatory Visit (INDEPENDENT_AMBULATORY_CARE_PROVIDER_SITE_OTHER): Payer: Managed Care, Other (non HMO) | Admitting: Internal Medicine

## 2023-10-12 ENCOUNTER — Encounter (INDEPENDENT_AMBULATORY_CARE_PROVIDER_SITE_OTHER): Payer: Self-pay | Admitting: Internal Medicine

## 2023-10-12 ENCOUNTER — Ambulatory Visit (INDEPENDENT_AMBULATORY_CARE_PROVIDER_SITE_OTHER): Payer: Managed Care, Other (non HMO) | Admitting: Internal Medicine

## 2023-10-12 VITALS — BP 136/84 | HR 100 | Temp 98.4°F | Ht 68.0 in | Wt 315.0 lb

## 2023-10-12 DIAGNOSIS — E66813 Obesity, class 3: Secondary | ICD-10-CM

## 2023-10-12 DIAGNOSIS — G4733 Obstructive sleep apnea (adult) (pediatric): Secondary | ICD-10-CM

## 2023-10-12 DIAGNOSIS — R7303 Prediabetes: Secondary | ICD-10-CM

## 2023-10-12 DIAGNOSIS — R638 Other symptoms and signs concerning food and fluid intake: Secondary | ICD-10-CM | POA: Diagnosis not present

## 2023-10-12 DIAGNOSIS — Z6841 Body Mass Index (BMI) 40.0 and over, adult: Secondary | ICD-10-CM

## 2023-10-12 MED ORDER — PHENTERMINE-TOPIRAMATE ER 11.25-69 MG PO CP24: 1.0000 | ORAL_CAPSULE | Freq: Every day | ORAL | 1 refills | Status: DC

## 2023-10-12 NOTE — Progress Notes (Signed)
 Office: [phone]  /  Fax: [phone]  Weight Summary And Biometrics  Vitals Temp: 98.4 F (36.9 C) BP: 136/84 Pulse Rate: 100 SpO2: 100 %   Anthropometric Measurements Height: 5' 8 (1.727 m) Weight: (!) 315 lb (142.9 kg) BMI (Calculated): 47.91 Weight at Last Visit: 317 lb Weight Lost Since Last Visit: 2 lb Weight Gained Since Last Visit: 0 lb Starting Weight: 360 lb Total Weight Loss (lbs): 51 lb (23.1 kg) Peak Weight: 361 lb   Body Composition  Body Fat %: 51.2 % Fat Mass (lbs): 161.6 lbs Muscle Mass (lbs): 146.4 lbs Total Body Water (lbs): 102.2 lbs Visceral Fat Rating : 18    No data recorded Today's Visit #: 13  Starting Date: 11/04/22   Subjective   Chief Complaint: Obesity  Lindsey Bowman is here to discuss her progress with her obesity treatment plan. She is on the the Category 3 Plan and states she is following her eating plan approximately 70 % of the time. She states she is exercising 15 minutes 2 times per week.  Weight Progress Since Last Visit:  Since last office visit she has lost 2 pounds. She reports fair adherence to reduced calorie nutritional plan. She has been working on reading food labels, not skipping meals, increasing protein intake at every meal, drinking more water, making healthier choices, reducing portion sizes, and incorporating more whole foods   Challenges affecting patient progress: low volume of physical activity at present .   Orexigenic Control: Denies problems with appetite and hunger signals.  Denies problems with satiety and satiation.  Denies problems with eating patterns and portion control.  Denies abnormal cravings. Denies feeling deprived or restricted.   Pharmacotherapy for weight management: She is currently taking Qsymia  with adequate clinical response  and without side effects..   Assessment and Plan   Treatment Plan For Obesity:  Recommended Dietary Goals  Lindsey Bowman is currently in the action stage  of change. As such, her goal is to continue weight management plan. She has agreed to: continue current plan  Behavioral Health and Counseling  We discussed the following behavioral modification strategies today: continue to work on maintaining a reduced calorie state, getting the recommended amount of protein, incorporating whole foods, making healthy choices, staying well hydrated and practicing mindfulness when eating..  Additional education and resources provided today: None  Recommended Physical Activity Goals  Lindsey Bowman has been advised to work up to 150 minutes of moderate intensity aerobic activity a week and strengthening exercises 2-3 times per week for cardiovascular health, weight loss maintenance and preservation of muscle mass.   She has agreed to :  Think about enjoyable ways to increase daily physical activity and overcoming barriers to exercise and Increase physical activity in their day and reduce sedentary time (increase NEAT).  Pharmacotherapy  We discussed various medication options to help Lindsey Bowman with her weight loss efforts and we both agreed to : adequate clinical response to current dose, continue current regimen  Associated Conditions Impacted by Obesity Treatment  OSA (obstructive sleep apnea) Assessment & Plan: On CPAP with reported good compliance. Continue PAP therapy. Losing 15% or more of body weight may improve AHI.  She has lost 13% of total body weight medically supervised weight management plan.  Continue current plan    Class 3 severe obesity due to excess calories with serious comorbidity and body mass index (BMI) of 50.0 to 59.9 in adult [facility]) Assessment & Plan: Patient has lost 51 pounds on medically supervised weight management plan.  She is currently on Qsymia  for pharmacotherapy and tolerating medication well without any side effects.  See obesity treatment plan  Orders: -     Phentermine -Topiramate ; Take 1 tablet by mouth daily.  Dispense: 30  capsule; Refill: 1  Prediabetes Assessment & Plan: Stable.  We are checking disease monitoring serologies today.  She did not tolerate metformin  in the past if her A1c remains elevated we will consider rechallenging her if her insulin  levels have improved.  She felt that metformin  was causing low blood sugar symptoms likely due to the presence of hyperinsulinism and decreased gluconeogenesis associated with metformin .  She continues to work on maintaining a diet with a low glycemic load.  Orders: -     Lipid Panel With LDL/HDL Ratio -     Insulin , random -     Hemoglobin A1c -     CMP14+EGFR  Abnormal food appetite Assessment & Plan: Improved on Qsymia .  She has increased orexigenic signaling, impaired satiety and inhibitory control. This is secondary to an abnormal energy regulation system and pathological neurohormonal pathways characteristic of excess adiposity.  In addition to nutritional and behavioral strategies she benefits from ongoing pharmacotherapy with Qsymia .  Low pregnancy risk.  Continue current dose.  No side effects reported       Objective   Physical Exam:  Blood pressure 136/84, pulse 100, temperature 98.4 F (36.9 C), height 5' 8 (1.727 m), weight (!) 315 lb (142.9 kg), last menstrual period 09/22/2023, SpO2 100%, unknown if currently breastfeeding. Body mass index is 47.9 kg/m.  General: She is overweight, cooperative, alert, well developed, and in no acute distress. PSYCH: Has normal mood, affect and thought process.   HEENT: EOMI, sclerae are anicteric. Lungs: Normal breathing effort, no conversational dyspnea. Extremities: No edema.  Neurologic: No gross sensory or motor deficits. No tremors or fasciculations noted.    Diagnostic Data Reviewed:  BMET    Component Value Date/Time   NA 137 11/04/2022 0829   K 4.8 11/04/2022 0829   CL 101 11/04/2022 0829   CO2 25 11/04/2022 0829   GLUCOSE 99 11/04/2022 0829   GLUCOSE 91 05/11/2009 0257   BUN 9  11/04/2022 0829   CREATININE 0.79 11/04/2022 0829   CALCIUM 9.5 11/04/2022 0829   GFRNONAA >60 05/11/2009 0257   GFRAA  05/11/2009 0257    >60        The eGFR has been calculated using the MDRD equation. This calculation has not been validated in all clinical situations. eGFR's persistently <60 mL/min signify possible Chronic Kidney Disease.   Lab Results  Component Value Date   HGBA1C 6.3 (H) 03/04/2023   HGBA1C 6.4 (H) 11/04/2022   Lab Results  Component Value Date   INSULIN  43.7 (H) 03/04/2023   INSULIN  57.0 (H) 11/04/2022   Lab Results  Component Value Date   TSH 1.760 11/04/2022   CBC    Component Value Date/Time   WBC 5.7 11/04/2022 0829   WBC 9.2 05/12/2009 0620   RBC 4.40 11/04/2022 0829   RBC 3.35 (L) 05/12/2009 0620   HGB 12.3 11/04/2022 0829   HCT 39.1 11/04/2022 0829   PLT 440 11/04/2022 0829   MCV 89 11/04/2022 0829   MCH 28.0 11/04/2022 0829   MCHC 31.5 11/04/2022 0829   MCHC 34.3 05/12/2009 0620   RDW 12.3 11/04/2022 0829   Iron Studies No results found for: IRON, TIBC, FERRITIN, IRONPCTSAT Lipid Panel     Component Value Date/Time   CHOL 154 11/04/2022 0829  TRIG 78 11/04/2022 0829   HDL 54 11/04/2022 0829   CHOLHDL 2.5 Ratio 09/02/2007 2043   VLDL 20 09/02/2007 2043   LDLCALC 85 11/04/2022 0829   Hepatic Function Panel     Component Value Date/Time   PROT 6.8 11/04/2022 0829   ALBUMIN 3.9 11/04/2022 0829   AST 13 11/04/2022 0829   ALT 17 11/04/2022 0829   ALKPHOS 48 11/04/2022 0829   BILITOT 0.3 11/04/2022 0829   BILIDIR <0.1 mg/dL 87/73/7991 7956   IBILI NOT CALC mg/dL 87/73/7991 7956      Component Value Date/Time   TSH 1.760 11/04/2022 0829   Nutritional Lab Results  Component Value Date   VD25OH 30.5 03/04/2023   VD25OH 22.5 (L) 11/04/2022    Follow-Up   No follow-ups on file.Lindsey Bowman She was informed of the importance of frequent follow up visits to maximize her success with intensive lifestyle modifications for  her multiple health conditions.  Attestation Statement   Reviewed by clinician on day of visit: allergies, medications, problem list, medical history, surgical history, family history, social history, and previous encounter notes.     Lucas Parker, MD

## 2023-10-12 NOTE — Assessment & Plan Note (Signed)
On CPAP with reported good compliance. Continue PAP therapy. Losing 15% or more of body weight may improve AHI.  She has lost 13% of total body weight medically supervised weight management plan.  Continue current plan

## 2023-10-12 NOTE — Assessment & Plan Note (Signed)
 Stable.  We are checking disease monitoring serologies today.  She did not tolerate metformin  in the past if her A1c remains elevated we will consider rechallenging her if her insulin  levels have improved.  She felt that metformin  was causing low blood sugar symptoms likely due to the presence of hyperinsulinism and decreased gluconeogenesis associated with metformin .  She continues to work on maintaining a diet with a low glycemic load.

## 2023-10-12 NOTE — Assessment & Plan Note (Signed)
Patient has lost 51 pounds on medically supervised weight management plan.  She is currently on Qsymia for pharmacotherapy and tolerating medication well without any side effects.  See obesity treatment plan

## 2023-10-12 NOTE — Assessment & Plan Note (Signed)
 Improved on Qsymia.  She has increased orexigenic signaling, impaired satiety and inhibitory control. This is secondary to an abnormal energy regulation system and pathological neurohormonal pathways characteristic of excess adiposity.  In addition to nutritional and behavioral strategies she benefits from ongoing pharmacotherapy with Qsymia.  Low pregnancy risk.  Continue current dose.  No side effects reported

## 2023-10-13 ENCOUNTER — Encounter (INDEPENDENT_AMBULATORY_CARE_PROVIDER_SITE_OTHER): Payer: Self-pay | Admitting: Internal Medicine

## 2023-10-13 LAB — CMP14+EGFR
ALT: 15 [IU]/L (ref 0–32)
AST: 16 [IU]/L (ref 0–40)
Albumin: 3.7 g/dL — ABNORMAL LOW (ref 3.9–4.9)
Alkaline Phosphatase: 49 [IU]/L (ref 44–121)
BUN/Creatinine Ratio: 9 (ref 9–23)
BUN: 8 mg/dL (ref 6–24)
Bilirubin Total: 0.4 mg/dL (ref 0.0–1.2)
CO2: 21 mmol/L (ref 20–29)
Calcium: 8.8 mg/dL (ref 8.7–10.2)
Chloride: 107 mmol/L — ABNORMAL HIGH (ref 96–106)
Creatinine, Ser: 0.86 mg/dL (ref 0.57–1.00)
Globulin, Total: 2.9 g/dL (ref 1.5–4.5)
Glucose: 92 mg/dL (ref 70–99)
Potassium: 4.4 mmol/L (ref 3.5–5.2)
Sodium: 141 mmol/L (ref 134–144)
Total Protein: 6.6 g/dL (ref 6.0–8.5)
eGFR: 83 mL/min/{1.73_m2} (ref >59)

## 2023-10-13 LAB — LIPID PANEL WITH LDL/HDL RATIO
Cholesterol, Total: 141 mg/dL (ref 100–199)
HDL: 56 mg/dL (ref >39)
LDL Chol Calc (NIH): 71 mg/dL (ref 0–99)
LDL/HDL Ratio: 1.3 {ratio} (ref 0.0–3.2)
Triglycerides: 68 mg/dL (ref 0–149)
VLDL Cholesterol Cal: 14 mg/dL (ref 5–40)

## 2023-10-13 LAB — INSULIN, RANDOM: INSULIN: 35.8 u[IU]/mL — ABNORMAL HIGH (ref 2.6–24.9)

## 2023-10-13 LAB — HEMOGLOBIN A1C
Est. average glucose Bld gHb Est-mCnc: 126 mg/dL
Hgb A1c MFr Bld: 6 % — ABNORMAL HIGH (ref 4.8–5.6)

## 2023-10-14 ENCOUNTER — Other Ambulatory Visit (INDEPENDENT_AMBULATORY_CARE_PROVIDER_SITE_OTHER): Payer: Self-pay | Admitting: Internal Medicine

## 2023-10-14 ENCOUNTER — Encounter (INDEPENDENT_AMBULATORY_CARE_PROVIDER_SITE_OTHER): Payer: Self-pay | Admitting: Internal Medicine

## 2023-10-14 MED ORDER — PHENTERMINE-TOPIRAMATE ER 11.25-69 MG PO CP24: 1.0000 | ORAL_CAPSULE | Freq: Every day | ORAL | 1 refills | Status: DC

## 2023-10-14 NOTE — Progress Notes (Signed)
 Patient requesting refill sent to a different pharmacy due to cost

## 2023-11-15 ENCOUNTER — Encounter: Payer: Self-pay | Admitting: Pulmonary Disease

## 2023-11-19 ENCOUNTER — Encounter (INDEPENDENT_AMBULATORY_CARE_PROVIDER_SITE_OTHER): Payer: Self-pay | Admitting: Internal Medicine

## 2023-11-22 ENCOUNTER — Encounter (INDEPENDENT_AMBULATORY_CARE_PROVIDER_SITE_OTHER): Payer: Self-pay | Admitting: Internal Medicine

## 2023-11-24 ENCOUNTER — Other Ambulatory Visit (INDEPENDENT_AMBULATORY_CARE_PROVIDER_SITE_OTHER): Payer: Self-pay | Admitting: Internal Medicine

## 2023-11-24 DIAGNOSIS — E66813 Obesity, class 3: Secondary | ICD-10-CM

## 2023-11-24 MED ORDER — PHENTERMINE-TOPIRAMATE ER 11.25-69 MG PO CP24: 1.0000 | ORAL_CAPSULE | Freq: Every day | ORAL | 0 refills | Status: DC

## 2023-11-24 NOTE — Progress Notes (Signed)
 Patient provided with a courtesy refill, she has a lapse in insurance coverage.  Advised to apply for charitable care through organization.  Patient notified that we cannot refill medication further without an office visit due to nature of medication and need to follow-up.

## 2023-11-25 ENCOUNTER — Other Ambulatory Visit (INDEPENDENT_AMBULATORY_CARE_PROVIDER_SITE_OTHER): Payer: Self-pay

## 2023-11-25 MED ORDER — PHENTERMINE-TOPIRAMATE ER 11.25-69 MG PO CP24: 1.0000 | ORAL_CAPSULE | Freq: Every day | ORAL | 0 refills | Status: DC

## 2023-12-15 ENCOUNTER — Ambulatory Visit (INDEPENDENT_AMBULATORY_CARE_PROVIDER_SITE_OTHER): Admitting: Internal Medicine

## 2023-12-15 ENCOUNTER — Encounter (INDEPENDENT_AMBULATORY_CARE_PROVIDER_SITE_OTHER): Payer: Self-pay | Admitting: Internal Medicine

## 2023-12-15 VITALS — BP 114/79 | HR 95 | Temp 97.9°F | Ht 68.0 in | Wt 314.0 lb

## 2023-12-15 DIAGNOSIS — R638 Other symptoms and signs concerning food and fluid intake: Secondary | ICD-10-CM | POA: Diagnosis not present

## 2023-12-15 DIAGNOSIS — R7303 Prediabetes: Secondary | ICD-10-CM | POA: Diagnosis not present

## 2023-12-15 DIAGNOSIS — Z6841 Body Mass Index (BMI) 40.0 and over, adult: Secondary | ICD-10-CM | POA: Diagnosis not present

## 2023-12-15 DIAGNOSIS — E66813 Obesity, class 3: Secondary | ICD-10-CM | POA: Diagnosis not present

## 2023-12-15 NOTE — Assessment & Plan Note (Signed)
 Improved on Qsymia.  She has increased orexigenic signaling, impaired satiety and inhibitory control. This is secondary to an abnormal energy regulation system and pathological neurohormonal pathways characteristic of excess adiposity.  In addition to nutritional and behavioral strategies she benefits from ongoing pharmacotherapy with Qsymia.  Low pregnancy risk.  Continue current dose.  No side effects reported

## 2023-12-15 NOTE — Assessment & Plan Note (Signed)
 She is affected by obesity and is currently on Qsymia 11.25 mg once daily. She has lost one pound since the last office visit. She reports following the category three meal plan about 10-20% of the time, is not tracking calories, [name], avoiding skipping meals, and consuming more whole foods, protein, [name], [name], vegetables, and protein - Increase hydration - Maintain regular exercise routine

## 2023-12-15 NOTE — Assessment & Plan Note (Signed)
 Most recent A1c is  Lab Results  Component Value Date   HGBA1C 6.0 (H) 10/12/2023   HGBA1C 6.4 (H) 11/04/2022   Her most recent A1c was 6.0 and improved.  She did not tolerate metformin in the past so she will continue with current weight management strategy.

## 2023-12-15 NOTE — Progress Notes (Signed)
 Office: [phone]  /  Fax: [phone]  Weight Summary And Biometrics  Vitals Temp: 97.9 F (36.6 C) BP: 114/79 Pulse Rate: 95 SpO2: 100 %   Anthropometric Measurements Height: 5\' 8"  (1.727 m) Weight: (!) 314 lb (142.4 kg) BMI (Calculated): 47.75 Weight at Last Visit: 315 lb Weight Lost Since Last Visit: 1 lb Weight Gained Since Last Visit: 0 lb Starting Weight: 360 lb Total Weight Loss (lbs): 46 lb (20.9 kg) Peak Weight: 361 lb   Body Composition  Body Fat %: 51.6 % Fat Mass (lbs): 162.4 lbs Muscle Mass (lbs): 144.6 lbs Total Body Water (lbs): 103.4 lbs Visceral Fat Rating : 18    No data recorded Today's Visit #: 14  Starting Date: 11/04/22   Subjective   Chief Complaint: Obesity  Interval History Discussed the use of AI scribe software for clinical note transcription with the patient, who gave verbal consent to proceed.  History of Present Illness Lindsey Bowman is a 48 year old female with obesity who presents for medical weight management.  She is currently on Qsymia 11.25 mg once daily and has experienced a slow weight loss, losing only one pound in the last two months. Previously, she had lost two pounds, six pounds, three pounds, and four pounds in successive periods. Her eating habits include following a category three meal plan about 10-20% of the time and not tracking calories. She consumes more whole foods but is not getting the recommended amount of protein or maintaining adequate hydration. She often skips meals and eats late at night, which is a change from her previous eating habits.  She exercises three days a week for 30 minutes, focusing on cardio. Despite her regular exercise routine, she feels frustrated with her slow weight loss progress. She sometimes goes for walks to manage her frustration.  She has a history of sleep apnea and prediabetes, which may be contributing to her overall health and weight management challenges.  She  lives with her son and is currently looking for work, which she finds challenging in the current market. She has been cooking at home more often due to financial constraints and has a freezer full of food, although she sometimes struggles with meal preparation.    Challenges affecting patient progress: moderate to high levels of stress and recent lapse in weight management plan due to work, travel or family circumstances.    Pharmacotherapy for weight management: She is currently taking Qsymia with adequate clinical response  and without side effects..   Assessment and Plan   Treatment Plan For Obesity:  Recommended Dietary Goals  [name] is currently in the action stage of change. As such, her goal is to continue weight management plan. She has agreed to: continue current plan  Behavioral Health and Counseling  We discussed the following behavioral modification strategies today: continue to work on maintaining a reduced calorie state, getting the recommended amount of protein, incorporating whole foods, making healthy choices, staying well hydrated and practicing mindfulness when eating..  Additional education and resources provided today: None  Recommended Physical Activity Goals  [name] has been advised to work up to 150 minutes of moderate intensity aerobic activity a week and strengthening exercises 2-3 times per week for cardiovascular health, weight loss maintenance and preservation of muscle mass.   She has agreed to :  Think about enjoyable ways to increase daily physical activity and overcoming barriers to exercise and Increase physical activity in their day and reduce sedentary time (increase NEAT).  Pharmacotherapy  We discussed various medication options to help [name] with her weight loss efforts and we both agreed to : adequate clinical response to current dose, continue current regimen  Associated Conditions Impacted by Obesity Treatment  Class 3 severe obesity with  serious comorbidity and body mass index (BMI) of 50.0 to 59.9 in adult, unspecified obesity type [name], is not tracking calories, [name], avoiding skipping meals, and consuming more whole foods, protein, [name], [name], vegetables, [name], impaired satiety and inhibitory control. This is secondary to an abnormal energy regulation system and pathological neurohormonal pathways characteristic of excess adiposity.  In addition to nutritional and behavioral strategies she benefits from ongoing pharmacotherapy with Qsymia.  Low pregnancy risk.  Continue current dose.  No side effects reported    Prediabetes Assessment & Plan: Most recent A1c is  Lab Results  Component Value Date   HGBA1C 6.0 (H) 10/12/2023   HGBA1C 6.4 (H) 11/04/2022   Her most recent A1c was 6.0 and improved.   She did not tolerate metformin in the past so she will continue with current weight management strategy.          Objective   Physical Exam:  Blood pressure 114/79, pulse 95, [name], height 5\' 8"  (1.727 m), weight (!) 314 lb (142.4 kg), last menstrual period 12/15/2023, SpO2 100%, unknown if currently breastfeeding. Body mass index is 47.74 kg/m.  General: She is overweight, cooperative, alert, well developed, and in no acute distress. PSYCH: Has normal mood, affect and thought process.   HEENT: EOMI, sclerae are anicteric. Lungs: Normal breathing effort, no conversational dyspnea. Extremities: No edema.  Neurologic: No gross sensory or motor deficits. No tremors or fasciculations noted.    Diagnostic Data Reviewed:  BMET    Component Value Date/Time   NA 141 10/12/2023 0908   K 4.4 10/12/2023 0908   CL 107 (H) 10/12/2023 0908   CO2 21 10/12/2023 0908   GLUCOSE 92 10/12/2023 0908   GLUCOSE 91 05/11/2009 0257   BUN 8 10/12/2023 0908   CREATININE 0.86 10/12/2023 0908   CALCIUM 8.8 10/12/2023 0908   GFRNONAA >60 05/11/2009 0257   GFRAA  05/11/2009 0257    >60        The eGFR has been calculated using the MDRD equation. This calculation has not been validated in all clinical situations. eGFR's persistently <60 mL/min signify possible Chronic Kidney Disease.   Lab Results  Component Value Date   HGBA1C 6.0 (H) 10/12/2023   HGBA1C 6.4 (H) 11/04/2022   Lab Results  Component Value Date   INSULIN 35.8 (H) 10/12/2023   INSULIN 57.0 (H) 11/04/2022   Lab Results  Component Value Date   TSH 1.760 11/04/2022   CBC    Component Value Date/Time   WBC 5.7 11/04/2022 0829   WBC 9.2 05/12/2009 0620   RBC 4.40 11/04/2022 0829   RBC 3.35 (L) 05/12/2009 0620   HGB 12.3 11/04/2022 0829   HCT 39.1 11/04/2022 0829   PLT 440 11/04/2022 0829   MCV 89 11/04/2022 0829   MCH 28.0 11/04/2022 0829   MCHC 31.5 11/04/2022 0829   MCHC 34.3 05/12/2009  0620   RDW 12.3 11/04/2022 0829   Iron Studies No results found for: "IRON", "TIBC", "FERRITIN", "IRONPCTSAT" Lipid Panel     Component Value Date/Time   CHOL 141 10/12/2023 0908   TRIG 68 10/12/2023 0908   HDL 56 10/12/2023 0908   CHOLHDL 2.5 Ratio 09/02/2007 2043   VLDL 20 09/02/2007 2043   LDLCALC 71 10/12/2023 0908   Hepatic Function Panel     Component Value Date/Time   PROT 6.6 10/12/2023 0908   ALBUMIN 3.7 (L) 10/12/2023 0908   AST 16 10/12/2023 0908   ALT 15 10/12/2023 0908   ALKPHOS 49 10/12/2023 0908   BILITOT 0.4 10/12/2023 0908   BILIDIR <0.1 mg/dL 40/98/1191 4782   IBILI NOT CALC mg/dL 95/62/1308 6578      Component Value Date/Time   TSH 1.760 11/04/2022 0829   Nutritional Lab Results  Component Value Date   VD25OH 30.5 03/04/2023   VD25OH 22.5 (L) 11/04/2022    Medications: Outpatient Encounter Medications as of 12/15/2023  Medication Sig   Phentermine-Topiramate 11.25-69 MG CP24 Take 1 tablet by mouth daily.   [DISCONTINUED] Cholecalciferol (VITAMIN D3) 50 MCG (2000 UT) capsule Take 1 capsule (2,000 Units total) by mouth daily.   No facility-administered encounter medications on file as of 12/15/2023.     Follow-Up   Return in about 4 weeks (around 01/12/2024).Marland Kitchen She was informed of the importance of frequent follow up visits to maximize her success with intensive lifestyle modifications for her multiple health conditions.  Attestation Statement   Reviewed by clinician on day of visit: allergies, medications, problem list, medical history, surgical history, family history, social history, and previous encounter notes.     Worthy Rancher, MD

## 2023-12-20 ENCOUNTER — Other Ambulatory Visit (INDEPENDENT_AMBULATORY_CARE_PROVIDER_SITE_OTHER): Payer: Self-pay | Admitting: Internal Medicine

## 2023-12-21 ENCOUNTER — Encounter (INDEPENDENT_AMBULATORY_CARE_PROVIDER_SITE_OTHER): Payer: Self-pay | Admitting: Internal Medicine

## 2023-12-23 ENCOUNTER — Other Ambulatory Visit (INDEPENDENT_AMBULATORY_CARE_PROVIDER_SITE_OTHER): Payer: Self-pay | Admitting: Internal Medicine

## 2023-12-23 DIAGNOSIS — G4733 Obstructive sleep apnea (adult) (pediatric): Secondary | ICD-10-CM

## 2023-12-23 DIAGNOSIS — R7303 Prediabetes: Secondary | ICD-10-CM

## 2023-12-23 DIAGNOSIS — R638 Other symptoms and signs concerning food and fluid intake: Secondary | ICD-10-CM

## 2024-02-01 ENCOUNTER — Encounter (HOSPITAL_BASED_OUTPATIENT_CLINIC_OR_DEPARTMENT_OTHER): Payer: Self-pay | Admitting: Family Medicine

## 2024-02-01 ENCOUNTER — Ambulatory Visit (INDEPENDENT_AMBULATORY_CARE_PROVIDER_SITE_OTHER): Payer: Self-pay | Admitting: Family Medicine

## 2024-02-01 VITALS — BP 131/86 | HR 84 | Ht 68.0 in | Wt 319.0 lb

## 2024-02-01 DIAGNOSIS — R7303 Prediabetes: Secondary | ICD-10-CM

## 2024-02-01 DIAGNOSIS — E559 Vitamin D deficiency, unspecified: Secondary | ICD-10-CM | POA: Diagnosis not present

## 2024-02-01 DIAGNOSIS — Z0000 Encounter for general adult medical examination without abnormal findings: Secondary | ICD-10-CM

## 2024-02-01 DIAGNOSIS — G4733 Obstructive sleep apnea (adult) (pediatric): Secondary | ICD-10-CM | POA: Diagnosis not present

## 2024-02-01 DIAGNOSIS — F32A Depression, unspecified: Secondary | ICD-10-CM | POA: Diagnosis not present

## 2024-02-01 NOTE — Assessment & Plan Note (Signed)
 Most recent vitamin D  level was within normal range, however was borderline.  Has not had recent check.  We can plan to check this with upcoming labs

## 2024-02-01 NOTE — Progress Notes (Signed)
 New Patient Office Visit  Subjective   Patient ID: Lindsey Bowman, female    DOB: 1976-04-27  Age: 48 y.o. MRN: [id number]  CC:  Chief Complaint  Patient presents with   New Patient (Initial Visit)    Patient is here today to get established with the practice denies any main concerns for today's visit.    HPI Lindsey Bowman presents to establish care Last PCP - [facility] in [place]  Weight: Does seen [facility] for [facility] management [facility]. Was taking Qsymia , has been without medication for a few weeks. Does have follow-up with them in the near future.  OSA: has CPAP, feels that symptoms are well-controlled with regular use. No concerns at this time. Has seen sleep medicine in the past.  Prediabetes: focusing on lifestyle modifications. No specific medications related to this. She does have family history of DM - father.  History of depression: She is seeing virtual provider for counseling/therapy. Has been doing this for about 6 weeks. Has taken Zoloft in the past. Feels that symptoms are manageable at present. Has some concerns about medications currently due to being on Qsymia , but might be changing meds.  Patient is originally from [place], has lived here for about 25 years. Patient is self-employed, Hydrologist. She enjoys traveling, spending time with friends and family.  Outpatient Encounter Medications as of 02/01/2024  Medication Sig   Phentermine -Topiramate  11.25-69 MG CP24 Take 1 tablet by mouth daily.   No facility-administered encounter medications on file as of 02/01/2024.    Past Medical History:  Diagnosis Date   Anemia    Anxiety    Arthritis    Back pain    Carpal tunnel syndrome    Depression    Edema of both lower extremities    HSV-2 infection    No pertinent past medical history    Obese    Osteoarthritis    Prediabetes    Sleep apnea    Uterine polyp    Vitamin D  deficiency     Past Surgical  History:  Procedure Laterality Date   DILATION AND CURETTAGE OF UTERUS     HYSTEROSCOPY     SHOULDER SURGERY Bilateral     Family History  Problem Relation Age of Onset   Diabetes Mother    High blood pressure Mother    Thyroid  disease Mother    Depression Mother    Anxiety disorder Mother    Liver disease Mother    Sleep apnea Mother    Obesity Mother    Diabetes Father    High blood pressure Father    Sleep apnea Father    Obesity Father    Miscarriages / Stillbirths Maternal Aunt     Social History   Socioeconomic History   Marital status: Divorced    Spouse name: Not on file   Number of children: Not on file   Years of education: Not on file   Highest education level: Bachelor's degree (e.g., BA, AB, BS)  Occupational History   Occupation: Diplomatic Services operational officer work  Tobacco Use   Smoking status: Former    Current packs/day: 0.00    Types: Cigarettes    Quit date: 02/26/1998    Years since quitting: 25.9   Smokeless tobacco: Never  Vaping Use   Vaping status: Never Used  Substance and Sexual Activity   Alcohol use: Yes    Alcohol/week: 2.0 standard drinks of alcohol    Types: 2 Cans of beer  per week    Comment: no while pregnant   Drug use: No   Sexual activity: Not Currently    Birth control/protection: Abstinence  Other Topics Concern   Not on file  Social History Narrative   Not on file   Social Drivers of Health   Financial Resource Strain: High Risk (01/25/2024)   Overall Financial Resource Strain (CARDIA)    Difficulty of Paying Living Expenses: Hard  Food Insecurity: Food Insecurity Present (01/25/2024)   Hunger Vital Sign    Worried About Running Out of Food in the Last Year: Sometimes true    Ran Out of Food in the Last Year: Sometimes true  Transportation Needs: No Transportation Needs (01/25/2024)   PRAPARE - Administrator, Civil Service (Medical): No    Lack of Transportation (Non-Medical): No  Physical Activity: Insufficiently Active  (01/25/2024)   Exercise Vital Sign    Days of Exercise per Week: 1 day    Minutes of Exercise per Session: 20 min  Stress: Stress Concern Present (01/25/2024)   [employer] of Occupational Health - Occupational Stress Questionnaire    Feeling of Stress : Very much  Social Connections: Moderately Integrated (01/25/2024)   Social Connection and Isolation Panel [NHANES]    Frequency of Communication with Friends and Family: Three times a week    Frequency of Social Gatherings with Friends and Family: Three times a week    Attends Religious Services: More than 4 times per year    Active Member of Clubs or Organizations: Yes    Attends Banker Meetings: More than 4 times per year    Marital Status: Divorced  Intimate Partner Violence: Unknown (12/10/2021)   Received from [employer], [facility]   HITS    Physically Hurt: Not on file    Insult or Talk Down To: Not on file    Threaten Physical Harm: Not on file    Scream or Curse: Not on file    Objective   BP 131/86 (BP Location: Right Wrist, Patient Position: Sitting, Cuff Size: Normal)   Pulse 84   Ht 5\' 8"  (1.727 m)   Wt (!) 319 lb (144.7 kg)   LMP 01/10/2024 (Exact Date)   SpO2 99%   Breastfeeding No   BMI 48.50 kg/m   Physical Exam  48 year old female in no acute distress Cardiovascular exam with regular rate and rhythm Lungs clear to auscultation bilaterally  Assessment & Plan:   Prediabetes Assessment & Plan: Recent hemoglobin A1c has been showing gradual improvement.  She continues with lifestyle modifications as well as medication through [facility].  We can plan to follow-up on A1c around time of next office visit  Orders: -     Hemoglobin A1c; Future  OSA (obstructive sleep apnea) Assessment & Plan: Can continue with use of CPAP.  Recommend intermittent follow-up with sleep medicine provider for management of CPAP and monitoring of sleep apnea   Vitamin D   deficiency Assessment & Plan: Most recent vitamin D  level was within normal range, however was borderline.  Has not had recent check.  We can plan to check this with upcoming labs  Orders: -     VITAMIN D  25 Hydroxy (Vit-D Deficiency, Fractures); Future  Wellness examination -     CBC with Differential/Platelet; Future -     Comprehensive metabolic panel with GFR; Future -     Hemoglobin A1c; Future -     Lipid panel; Future -  TSH Rfx on Abnormal to Free T4; Future -     VITAMIN D  25 Hydroxy (Vit-D Deficiency, Fractures); Future  Depression, unspecified depression type Assessment & Plan: Currently utilizing counseling to help with controlling symptoms.  Has been on Zoloft in the past with good control of symptoms.  She is not currently utilizing any medication and this is partly related to having been on Qsymia  recently and concerns about potential medication interactions.  For now, we will continue with counseling and she will be discussing medications with [facility] in the near future.  We can look into medication considerations once medication is decided upon through [facility].   Return in about 6 weeks (around 03/14/2024) for CPE with fasting labs 1 week prior.    ___________________________________________ Loukisha Gunnerson de [place], MD, ABFM, CAQSM Primary Care and Sports Medicine [facility]

## 2024-02-01 NOTE — Assessment & Plan Note (Signed)
 Recent hemoglobin A1c has been showing gradual improvement.  She continues with lifestyle modifications as well as medication through [facility].  We can plan to follow-up on A1c around time of next office visit

## 2024-02-01 NOTE — Assessment & Plan Note (Signed)
 Can continue with use of CPAP.  Recommend intermittent follow-up with sleep medicine provider for management of CPAP and monitoring of sleep apnea

## 2024-02-01 NOTE — Assessment & Plan Note (Signed)
 Currently utilizing counseling to help with controlling symptoms.  Has been on Zoloft in the past with good control of symptoms.  She is not currently utilizing any medication and this is partly related to having been on Qsymia  recently and concerns about potential medication interactions.  For now, we will continue with counseling and she will be discussing medications with [facility] clinic in the near future.  We can look into medication considerations once medication is decided upon through [facility].

## 2024-02-03 ENCOUNTER — Ambulatory Visit (INDEPENDENT_AMBULATORY_CARE_PROVIDER_SITE_OTHER): Admitting: Internal Medicine

## 2024-02-03 ENCOUNTER — Encounter (INDEPENDENT_AMBULATORY_CARE_PROVIDER_SITE_OTHER): Payer: Self-pay | Admitting: Internal Medicine

## 2024-02-03 VITALS — BP 126/84 | HR 83 | Temp 98.2°F | Ht 68.0 in | Wt 312.0 lb

## 2024-02-03 DIAGNOSIS — R7303 Prediabetes: Secondary | ICD-10-CM

## 2024-02-03 DIAGNOSIS — E66813 Obesity, class 3: Secondary | ICD-10-CM | POA: Diagnosis not present

## 2024-02-03 DIAGNOSIS — G4733 Obstructive sleep apnea (adult) (pediatric): Secondary | ICD-10-CM

## 2024-02-03 DIAGNOSIS — Z6841 Body Mass Index (BMI) 40.0 and over, adult: Secondary | ICD-10-CM

## 2024-02-03 MED ORDER — WEGOVY 0.25 MG/0.5ML ~~LOC~~ SOAJ
0.2500 mg | SUBCUTANEOUS | 0 refills | Status: DC
Start: 2024-02-03 — End: 2024-02-29

## 2024-02-03 NOTE — Assessment & Plan Note (Signed)
 Obesity code changed by system.  She has a BMI of 47 and has lost 57 pounds on Qsymia  she has strong orixegenic signaling and is having breakthrough hunger and impaired satiety.  She has sleep apnea and prediabetes.  She is a good candidate for GLP-1 therapy.  After discussion of benefits, side effects and education on long-term use of medication she is agreeable to starting Wegovy 0.25 mg once a week.

## 2024-02-03 NOTE — Progress Notes (Signed)
 Office: [phone]  /  Fax: [phone]  Weight Summary And Biometrics  Vitals Temp: 98.2 F (36.8 C) BP: 126/84 Pulse Rate: 83 SpO2: 100 %   Anthropometric Measurements Height: 5\' 8"  (1.727 m) Weight: (!) 312 lb (141.5 kg) BMI (Calculated): 47.45 Weight at Last Visit: 314lb Weight Lost Since Last Visit: 2lb Weight Gained Since Last Visit: 0lb Starting Weight: 360lb Total Weight Loss (lbs): 48 lb (21.8 kg) Peak Weight: 361lb   Body Composition  Body Fat %: 52.6 % Fat Mass (lbs): 164.6 lbs Muscle Mass (lbs): 140.8 lbs Total Body Water (lbs): 103 lbs Visceral Fat Rating : 18    No data recorded Today's Visit #: 15  Starting Date: 11/04/22   Subjective   Chief Complaint: Obesity  Interval History Discussed the use of AI scribe software for clinical note transcription with the patient, who gave verbal consent to proceed.  History of Present Illness   Lindsey Bowman is a 48 year old female with prediabetes and sleep apnea who presents for medical weight management.  She has been actively participating in a medical weight management program and has successfully lost a total of 48 pounds, including 2 pounds since her last visit. Her history of prediabetes, abnormal food appetite, and sleep apnea are being managed as part of her weight loss efforts.  She is currently taking Qsymia  for weight management. There has been confusion with her insurance coverage following a recent switch. Initially, her insurance rejected Qsymia . She is considering switching to a GLP-1 medication, as it is also listed on her plan.       Challenges affecting patient progress: Strong hunger signals, low volume of physical activity   Pharmacotherapy for weight management: She is currently taking Qsymia  with adequate clinical response  and without side effects..   Assessment and Plan   Treatment Plan For Obesity:  Recommended Dietary Goals  Lindsey Bowman is currently in the action  stage of change. As such, her goal is to continue weight management plan. She has agreed to: continue current plan and continue to work on implementation of reduced calorie nutrition plan (RCNP)  Behavioral Health and Counseling  We discussed the following behavioral modification strategies today: continue to work on maintaining a reduced calorie state, getting the recommended amount of protein, incorporating whole foods, making healthy choices, staying well hydrated and practicing mindfulness when eating..  Additional education and resources provided today: Handout and education on common side effects of GLP-1 therapy and management  Recommended Physical Activity Goals  Lindsey Bowman has been advised to work up to 150 minutes of moderate intensity aerobic activity a week and strengthening exercises 2-3 times per week for cardiovascular health, weight loss maintenance and preservation of muscle mass.   She has agreed to :  Think about enjoyable ways to increase daily physical activity and overcoming barriers to exercise and Increase physical activity in their day and reduce sedentary time (increase NEAT).  Pharmacotherapy  We discussed various medication options to help Lindsey Bowman with her weight loss efforts and we both agreed to : start anti-obesity medication.  In addition to reduced calorie nutrition plan (RCNP), behavioral strategies and physical activity, Lindsey Bowman would benefit from pharmacotherapy to assist with hunger signals, satiety and cravings. This will reduce obesity-related health risks by inducing weight loss, and help reduce food consumption and adherence to Lindsey Bowman) . It may also improve QOL by improving self-confidence and reduce the  setbacks associated with metabolic adaptations.  She has been on Qsymia  and has lost 48  pounds but is experiencing increased hunger signals affecting her weight loss rate.  She also has high risk comorbidities including sleep apnea and prediabetes  After  discussion of treatment options, mechanisms of action, benefits, side effects, contraindications and shared decision making she is agreeable to starting Wegovy 0.25 mg once a week. Patient also made aware that medication is indicated for long-term management of obesity and the risk of weight regain following discontinuation of treatment and hence the importance of adhering to medical weight loss plan.  We demonstrated use of device and patient using teach back method was able to demonstrate proper technique.  Associated Conditions Impacted by Obesity Treatment  Class 3 severe obesity with serious comorbidity and body mass index (BMI) of 50.0 to 59.9 in adult, unspecified obesity type Assessment & Plan: Obesity code changed by system.  She has a BMI of 47 and has lost 57 pounds on Qsymia  she has strong orixegenic signaling and is having breakthrough hunger and impaired satiety.  She has sleep apnea and prediabetes.  She is a good candidate for GLP-1 therapy.  After discussion of benefits, side effects and education on long-term use of medication she is agreeable to starting Wegovy 0.25 mg once a week.  Orders: -     Wegovy; Inject 0.25 mg into the skin once a week.  Dispense: 2 mL; Refill: 0  OSA (obstructive sleep apnea) Assessment & Plan: On CPAP with reported good compliance. Continue PAP therapy. Losing 15% or more of body weight may improve AHI.  She has lost 13% of total body weight medically supervised weight management plan, she would benefit from GLP-1 therapy.  She will be started on Wegovy 0.25 mg once a week   Orders: -     [name]; Inject 0.25 mg into the skin once a week.  Dispense: 2 mL; Refill: 0  Prediabetes Assessment & Plan: Most recent A1c is  Lab Results  Component Value Date   HGBA1C 6.0 (H) 10/12/2023   HGBA1C 6.4 (H) 11/04/2022   Her most recent A1c was 6.0 and improved.  She did not tolerate metformin  in the past so she will continue with current weight management  strategy.  In addition we will add Wegovy 0.25 mg once a week for pharmacoprophylaxis.  Orders: -     Wegovy; Inject 0.25 mg into the skin once a week.  Dispense: 2 mL; Refill: 0       Objective   Physical Exam:  Blood pressure 126/84, pulse 83, temperature 98.2 F (36.8 C), height 5\' 8"  (1.727 m), weight (!) 312 lb (141.5 kg), last menstrual period 01/10/2024, SpO2 100%. Body mass index is 47.44 kg/m.  General: She is overweight, cooperative, alert, well developed, and in no acute distress. PSYCH: Has normal mood, affect and thought process.   HEENT: EOMI, sclerae are anicteric. Lungs: Normal breathing effort, no conversational dyspnea. Extremities: No edema.  Neurologic: No gross sensory or motor deficits. No tremors or fasciculations noted.    Diagnostic Data Reviewed:  BMET    Component Value Date/Time   NA 141 10/12/2023 0908   K 4.4 10/12/2023 0908   CL 107 (H) 10/12/2023 0908   CO2 21 10/12/2023 0908   GLUCOSE 92 10/12/2023 0908   GLUCOSE 91 05/11/2009 0257   BUN 8 10/12/2023 0908   CREATININE 0.86 10/12/2023 0908   CALCIUM 8.8 10/12/2023 0908   GFRNONAA >60 05/11/2009 0257   GFRAA  05/11/2009 0257    >60        The  eGFR has been calculated using the MDRD equation. This calculation has not been validated in all clinical situations. eGFR's persistently <60 mL/min signify possible Chronic Kidney Disease.   Lab Results  Component Value Date   HGBA1C 6.0 (H) 10/12/2023   HGBA1C 6.4 (H) 11/04/2022   Lab Results  Component Value Date   INSULIN  35.8 (H) 10/12/2023   INSULIN  57.0 (H) 11/04/2022   Lab Results  Component Value Date   TSH 1.760 11/04/2022   CBC    Component Value Date/Time   WBC 5.7 11/04/2022 0829   WBC 9.2 05/12/2009 0620   RBC 4.40 11/04/2022 0829   RBC 3.35 (L) 05/12/2009 0620   HGB 12.3 11/04/2022 0829   HCT 39.1 11/04/2022 0829   PLT 440 11/04/2022 0829   MCV 89 11/04/2022 0829   MCH 28.0 11/04/2022 0829   MCHC 31.5  11/04/2022 0829   MCHC 34.3 05/12/2009 0620   RDW 12.3 11/04/2022 0829   Iron Studies No results found for: "IRON", "TIBC", "FERRITIN", "IRONPCTSAT" Lipid Panel     Component Value Date/Time   CHOL 141 10/12/2023 0908   TRIG 68 10/12/2023 0908   HDL 56 10/12/2023 0908   CHOLHDL 2.5 Ratio 09/02/2007 2043   VLDL 20 09/02/2007 2043   LDLCALC 71 10/12/2023 0908   Hepatic Function Panel     Component Value Date/Time   PROT 6.6 10/12/2023 0908   ALBUMIN 3.7 (L) 10/12/2023 0908   AST 16 10/12/2023 0908   ALT 15 10/12/2023 0908   ALKPHOS 49 10/12/2023 0908   BILITOT 0.4 10/12/2023 0908   BILIDIR <0.1 mg/dL 82/95/6213 0865   IBILI NOT CALC mg/dL 78/46/9629 5284      Component Value Date/Time   TSH 1.760 11/04/2022 0829   Nutritional Lab Results  Component Value Date   VD25OH 30.5 03/04/2023   VD25OH 22.5 (L) 11/04/2022    Medications: Outpatient Encounter Medications as of 02/03/2024  Medication Sig   Phentermine -Topiramate  11.25-69 MG CP24 Take 1 tablet by mouth daily.   Semaglutide-Weight Management (WEGOVY) 0.25 MG/0.5ML SOAJ Inject 0.25 mg into the skin once a week.   No facility-administered encounter medications on file as of 02/03/2024.     Follow-Up   Return in about 4 weeks (around 03/02/2024) for For Weight Mangement with Dr. Allie Area.Aaron Aas She was informed of the importance of frequent follow up visits to maximize her success with intensive lifestyle modifications for her multiple health conditions.  Attestation Statement   Reviewed by clinician on day of visit: allergies, medications, problem list, medical history, surgical history, family history, social history, and previous encounter notes.     Ladd Picker, MD

## 2024-02-03 NOTE — Assessment & Plan Note (Signed)
 On CPAP with reported good compliance. Continue PAP therapy. Losing 15% or more of body weight may improve AHI.  She has lost 13% of total body weight medically supervised weight management plan, she would benefit from GLP-1 therapy.  She will be started on Wegovy 0.25 mg once a week

## 2024-02-03 NOTE — Assessment & Plan Note (Signed)
 Most recent A1c is  Lab Results  Component Value Date   HGBA1C 6.0 (H) 10/12/2023   HGBA1C 6.4 (H) 11/04/2022   Her most recent A1c was 6.0 and improved.  She did not tolerate metformin  in the past so she will continue with current weight management strategy.  In addition we will add Wegovy 0.25 mg once a week for pharmacoprophylaxis.

## 2024-02-07 ENCOUNTER — Encounter (INDEPENDENT_AMBULATORY_CARE_PROVIDER_SITE_OTHER): Payer: Self-pay | Admitting: Internal Medicine

## 2024-02-07 ENCOUNTER — Telehealth (INDEPENDENT_AMBULATORY_CARE_PROVIDER_SITE_OTHER): Payer: Self-pay

## 2024-02-07 NOTE — Telephone Encounter (Signed)
 Lindsey Bowman (Key: [id number]) Rx #: [id number] Wegovy  0.25MG /0.5ML auto-injectors

## 2024-02-10 ENCOUNTER — Ambulatory Visit (HOSPITAL_BASED_OUTPATIENT_CLINIC_OR_DEPARTMENT_OTHER): Payer: Self-pay | Admitting: Family Medicine

## 2024-02-29 ENCOUNTER — Ambulatory Visit (INDEPENDENT_AMBULATORY_CARE_PROVIDER_SITE_OTHER): Admitting: Internal Medicine

## 2024-02-29 ENCOUNTER — Encounter (INDEPENDENT_AMBULATORY_CARE_PROVIDER_SITE_OTHER): Payer: Self-pay | Admitting: Internal Medicine

## 2024-02-29 DIAGNOSIS — G4733 Obstructive sleep apnea (adult) (pediatric): Secondary | ICD-10-CM

## 2024-02-29 DIAGNOSIS — Z6841 Body Mass Index (BMI) 40.0 and over, adult: Secondary | ICD-10-CM | POA: Diagnosis not present

## 2024-02-29 DIAGNOSIS — R7303 Prediabetes: Secondary | ICD-10-CM

## 2024-02-29 DIAGNOSIS — E66813 Obesity, class 3: Secondary | ICD-10-CM

## 2024-02-29 MED ORDER — PHENTERMINE-TOPIRAMATE ER 11.25-69 MG PO CP24: 1.0000 | ORAL_CAPSULE | Freq: Every day | ORAL | 0 refills | Status: DC

## 2024-02-29 MED ORDER — WEGOVY 0.5 MG/0.5ML ~~LOC~~ SOAJ: 0.5000 mg | SUBCUTANEOUS | 0 refills | Status: DC

## 2024-02-29 NOTE — Progress Notes (Unsigned)
 Office: [phone]  /  Fax: [phone]  Weight Summary and Body Composition Analysis (BIA)  Vitals Temp: 98.2 F (36.8 C) BP: 120/70 Pulse Rate: 98 SpO2: 98 %   Anthropometric Measurements Height: 5' 8 (1.727 m) Weight: (!) 317 lb (143.8 kg) BMI (Calculated): 48.21 Weight at Last Visit: 312 lb Weight Lost Since Last Visit: 0 lb Weight Gained Since Last Visit: 5 lb Starting Weight: 360 lb Total Weight Loss (lbs): 43 lb (19.5 kg) Peak Weight: 361 lb   Body Composition  Body Fat %: 49.8 % Fat Mass (lbs): 158.2 lbs Muscle Mass (lbs): 151.4 lbs Total Body Water (lbs): 114.6 lbs Visceral Fat Rating : 17    RMR: 2102  Today's Visit #: 16  Starting Date: 11/04/22   Subjective   Chief Complaint: Obesity  Interval History Discussed the use of AI scribe software for clinical note transcription with the patient, who gave verbal consent to proceed.  History of Present Illness   Lindsey Bowman is a 48 year old female who presents for medical weight management.  Weight gain and dietary adherence - Five-pound weight gain since last visit despite following a 1500 calorie diet with suboptimal adherence - Engages in exercise seven days per week, twenty minutes per session  Appetite dysregulation and eating behaviors - Current eating behavior described as 'out of control' with increased hunger and food cravings while transitioning from Qsymia  to Wegovy  - Consumes hyperpalatable foods such as cake, chocolate, and ice cream, which contribute to increased hunger and cravings - Protein-rich foods such as cottage cheese and [place] yogurt provide better satiety than sugary foods  Pharmacologic weight management - Currently taking a low dose of Wegovy  without significant effects to date - Previously used Qsymia  until one to two weeks prior to starting Wegovy , with better control over eating habits while on Qsymia  - One pill of Qsymia  remaining  Menstrual-related  symptoms - Increased hunger and food cravings attributed in part to hormonal changes related to menstrual cycle - Headaches and nausea occur two weeks prior to onset of menses      Challenges affecting patient progress: strong hunger signals and/or impaired satiety / inhibitory control and having difficulty focusing on healthy eating.    Pharmacotherapy for weight management: She is currently taking Wegovy  without clinical response, without side effects., and currently in the titration phase transitioning from Qsymia  having a hard time due to increased hunger signals.   Assessment and Plan   Treatment Plan For Obesity:  Recommended Dietary Goals  Lawana is currently in the action stage of change. As such, her goal is to continue weight management plan. She has agreed to: portion control, balanced plate and making smarter food choices, such as increasing vegetables, protein intake and reducing simple carbohydrates and processed foods   Behavioral Health and Counseling  We discussed the following behavioral modification strategies today: increasing lean protein intake to established goals, decreasing simple carbohydrates , increasing fiber rich foods, increasing water intake , practice mindfulness eating and understand the difference between hunger signals and cravings, work on managing stress, creating time for self-care and relaxation, continue to work on implementation of reduced calorie nutritional plan, and better snacking choices.  Additional education and resources provided today: None  Recommended Physical Activity Goals  Sehar has been advised to work up to 150 minutes of moderate intensity aerobic activity a week and strengthening exercises 2-3 times per week for cardiovascular health, weight loss maintenance and preservation of muscle mass.   She has  agreed to :  continue to gradually increase the amount and intensity of exercise routine  Medical Interventions and  Pharmacotherapy  We discussed various medication options to help Marlen with her weight loss efforts and we both agreed to : She will resume treatment with Qsymia  due to increased hunger signals while up titration of Wegovy .  Once she gets to 1.7 mg a week we could taper off Qsymia .  1.  She is not at risk for pregnancy 2.  Reviewed again common medication side effects of Qsymia  3.  Reviewed state registry for controlled substances no other controlled substances found 4.  Increase Wegovy  2.5 mg once a week  Associated Conditions Impacted by Obesity Treatment  Class 3 severe obesity with serious comorbidity and body mass index (BMI) of 50.0 to 59.9 in adult, unspecified obesity type -     Phentermine -Topiramate  ER; Take 1 tablet by mouth daily.  Dispense: 30 capsule; Refill: 0 -     Wegovy ; Inject 0.5 mg into the skin once a week.  Dispense: 2 mL; Refill: 0  OSA (obstructive sleep apnea) -     Wegovy ; Inject 0.5 mg into the skin once a week.  Dispense: 2 mL; Refill: 0  Prediabetes     Assessment and Plan    Obesity She is experiencing weight gain despite a 1500 calorie diet with suboptimal adherence and exercising seven days a week for 20 minutes. She has gained five pounds since the last visit. The current dose of Wegovy  is low, and she is not experiencing the expected weight loss or appetite suppression. Higher doses of Wegovy  (1.7 mg or 2.4 mg) are typically where significant weight loss and appetite suppression occur. Previously used Qsymia  and reported better control over her appetite and food cravings while on it. Discussed 'food noise' and how hyperpalatable foods can increase hunger and cravings, leading to increased insulin  levels and difficulty controlling eating habits.  - Increase Wegovy  to 0.5 mg. - Overlap Qsymia  with Wegovy  until reaching 1.7 mg of Wegovy . - Monitor heart rate when restarting Qsymia . - Educate on healthy eating habits and avoiding hyperpalatable foods. -  Use AI tools like ChatGPT to create a meal plan.    Prediabetes Patient to work on reducing simple and added sugars in her diet.  She is now on Wegovy  for pharmacoprophylaxis will continue medication with upward titration.  OSA On CPAP with reported good compliance. Continue PAP therapy. Losing 15% or more of body weight may improve AHI.    Abnormal food appetite She has increased orexigenic signaling, impaired satiety and inhibitory control. This is secondary to an abnormal energy regulation system and pathological neurohormonal pathways characteristic of excess adiposity.  In addition to nutritional and behavioral strategies she benefits from pharmacotherapy.  She is currently on Wegovy  0.25 mg and was being transitioned from Qsymia  she is having a difficult time due to increase in orexigenic signaling I therefore want to overlap until she gets up to Wegovy  1.7.  She has gained 5 pounds because of this.     Objective   Physical Exam:  Blood pressure 120/70, pulse 98, temperature 98.2 F (36.8 C), height 5' 8 (1.727 m), weight (!) 317 lb (143.8 kg), last menstrual period 02/07/2024, SpO2 98%. Body mass index is 48.2 kg/m.  General: She is overweight, cooperative, alert, well developed, and in no acute distress. PSYCH: Has normal mood, affect and thought process.   HEENT: EOMI, sclerae are anicteric. Lungs: Normal breathing effort, no conversational dyspnea. Extremities: No edema.  Neurologic: No gross sensory or motor deficits. No tremors or fasciculations noted.    Diagnostic Data Reviewed:  BMET    Component Value Date/Time   NA 141 10/12/2023 0908   K 4.4 10/12/2023 0908   CL 107 (H) 10/12/2023 0908   CO2 21 10/12/2023 0908   GLUCOSE 92 10/12/2023 0908   GLUCOSE 91 05/11/2009 0257   BUN 8 10/12/2023 0908   CREATININE 0.86 10/12/2023 0908   CALCIUM 8.8 10/12/2023 0908   GFRNONAA >60 05/11/2009 0257   GFRAA  05/11/2009 0257    >60        The eGFR has been  calculated using the MDRD equation. This calculation has not been validated in all clinical situations. eGFR's persistently <60 mL/min signify possible Chronic Kidney Disease.   Lab Results  Component Value Date   HGBA1C 6.0 (H) 10/12/2023   HGBA1C 6.4 (H) 11/04/2022   Lab Results  Component Value Date   INSULIN  35.8 (H) 10/12/2023   INSULIN  57.0 (H) 11/04/2022   Lab Results  Component Value Date   TSH 1.760 11/04/2022   CBC    Component Value Date/Time   WBC 5.7 11/04/2022 0829   WBC 9.2 05/12/2009 0620   RBC 4.40 11/04/2022 0829   RBC 3.35 (L) 05/12/2009 0620   HGB 12.3 11/04/2022 0829   HCT 39.1 11/04/2022 0829   PLT 440 11/04/2022 0829   MCV 89 11/04/2022 0829   MCH 28.0 11/04/2022 0829   MCHC 31.5 11/04/2022 0829   MCHC 34.3 05/12/2009 0620   RDW 12.3 11/04/2022 0829   Iron Studies No results found for: IRON, TIBC, FERRITIN, IRONPCTSAT Lipid Panel     Component Value Date/Time   CHOL 141 10/12/2023 0908   TRIG 68 10/12/2023 0908   HDL 56 10/12/2023 0908   CHOLHDL 2.5 Ratio 09/02/2007 2043   VLDL 20 09/02/2007 2043   LDLCALC 71 10/12/2023 0908   Hepatic Function Panel     Component Value Date/Time   PROT 6.6 10/12/2023 0908   ALBUMIN 3.7 (L) 10/12/2023 0908   AST 16 10/12/2023 0908   ALT 15 10/12/2023 0908   ALKPHOS 49 10/12/2023 0908   BILITOT 0.4 10/12/2023 0908   BILIDIR <0.1 mg/dL 87/73/7991 7956   IBILI NOT CALC mg/dL 87/73/7991 7956      Component Value Date/Time   TSH 1.760 11/04/2022 0829   Nutritional Lab Results  Component Value Date   VD25OH 30.5 03/04/2023   VD25OH 22.5 (L) 11/04/2022    Medications: Outpatient Encounter Medications as of 02/29/2024  Medication Sig   Semaglutide -Weight Management (WEGOVY ) 0.5 MG/0.5ML SOAJ Inject 0.5 mg into the skin once a week.   [DISCONTINUED] Semaglutide -Weight Management (WEGOVY ) 0.25 MG/0.5ML SOAJ Inject 0.25 mg into the skin once a week.   Phentermine -Topiramate  ER 11.25-69  MG CP24 Take 1 tablet by mouth daily.   [DISCONTINUED] Phentermine -Topiramate  11.25-69 MG CP24 Take 1 tablet by mouth daily.   No facility-administered encounter medications on file as of 02/29/2024.     Follow-Up   Return in about 3 weeks (around 03/21/2024) for For Weight Mangement with Dr. Francyne.SABRA She was informed of the importance of frequent follow up visits to maximize her success with intensive lifestyle modifications for her multiple health conditions.  Attestation Statement   Reviewed by clinician on day of visit: allergies, medications, problem list, medical history, surgical history, family history, social history, and previous encounter notes.     Lucas Francyne, MD

## 2024-03-02 ENCOUNTER — Other Ambulatory Visit (HOSPITAL_BASED_OUTPATIENT_CLINIC_OR_DEPARTMENT_OTHER): Payer: Self-pay | Admitting: *Deleted

## 2024-03-02 ENCOUNTER — Other Ambulatory Visit (HOSPITAL_COMMUNITY): Payer: Self-pay | Admitting: Family Medicine

## 2024-03-02 DIAGNOSIS — Z0000 Encounter for general adult medical examination without abnormal findings: Secondary | ICD-10-CM

## 2024-03-02 DIAGNOSIS — R7303 Prediabetes: Secondary | ICD-10-CM

## 2024-03-02 DIAGNOSIS — E559 Vitamin D deficiency, unspecified: Secondary | ICD-10-CM

## 2024-03-03 ENCOUNTER — Encounter (INDEPENDENT_AMBULATORY_CARE_PROVIDER_SITE_OTHER): Payer: Self-pay | Admitting: Internal Medicine

## 2024-03-03 LAB — COMPREHENSIVE METABOLIC PANEL WITH GFR
ALT: 18 IU/L (ref 0–32)
AST: 20 IU/L (ref 0–40)
Albumin: 4.1 g/dL (ref 3.9–4.9)
Alkaline Phosphatase: 51 IU/L (ref 44–121)
BUN/Creatinine Ratio: 7 — ABNORMAL LOW (ref 9–23)
BUN: 6 mg/dL (ref 6–24)
Bilirubin Total: 0.3 mg/dL (ref 0.0–1.2)
CO2: 22 mmol/L (ref 20–29)
Calcium: 9.4 mg/dL (ref 8.7–10.2)
Chloride: 102 mmol/L (ref 96–106)
Creatinine, Ser: 0.81 mg/dL (ref 0.57–1.00)
Globulin, Total: 3 g/dL (ref 1.5–4.5)
Glucose: 92 mg/dL (ref 70–99)
Potassium: 4.4 mmol/L (ref 3.5–5.2)
Sodium: 139 mmol/L (ref 134–144)
Total Protein: 7.1 g/dL (ref 6.0–8.5)
eGFR: 89 mL/min/{1.73_m2} (ref >59)

## 2024-03-03 LAB — CBC WITH DIFFERENTIAL/PLATELET
Basophils Absolute: 0.1 10*3/uL (ref 0.0–0.2)
Basos: 1 %
EOS (ABSOLUTE): 0.2 10*3/uL (ref 0.0–0.4)
Eos: 4 %
Hematocrit: 42.4 % (ref 34.0–46.6)
Hemoglobin: 13.2 g/dL (ref 11.1–15.9)
Immature Grans (Abs): 0 10*3/uL (ref 0.0–0.1)
Immature Granulocytes: 0 %
Lymphocytes Absolute: 2.2 10*3/uL (ref 0.7–3.1)
Lymphs: 36 %
MCH: 29.4 pg (ref 26.6–33.0)
MCHC: 31.1 g/dL — ABNORMAL LOW (ref 31.5–35.7)
MCV: 94 fL (ref 79–97)
Monocytes Absolute: 0.7 10*3/uL (ref 0.1–0.9)
Monocytes: 11 %
Neutrophils Absolute: 2.9 10*3/uL (ref 1.4–7.0)
Neutrophils: 47 %
Platelets: 423 10*3/uL (ref 150–450)
RBC: 4.49 x10E6/uL (ref 3.77–5.28)
RDW: 12.2 % (ref 11.7–15.4)
WBC: 6 10*3/uL (ref 3.4–10.8)

## 2024-03-03 LAB — VITAMIN D 25 HYDROXY (VIT D DEFICIENCY, FRACTURES): Vit D, 25-Hydroxy: 27.3 ng/mL — ABNORMAL LOW (ref 30.0–100.0)

## 2024-03-03 LAB — LIPID PANEL
Chol/HDL Ratio: 2.6 ratio (ref 0.0–4.4)
Cholesterol, Total: 152 mg/dL (ref 100–199)
HDL: 58 mg/dL (ref >39)
LDL Chol Calc (NIH): 81 mg/dL (ref 0–99)
Triglycerides: 63 mg/dL (ref 0–149)
VLDL Cholesterol Cal: 13 mg/dL (ref 5–40)

## 2024-03-03 LAB — HEMOGLOBIN A1C
Est. average glucose Bld gHb Est-mCnc: 114 mg/dL
Hgb A1c MFr Bld: 5.6 % (ref 4.8–5.6)

## 2024-03-03 LAB — TSH RFX ON ABNORMAL TO FREE T4: TSH: 1.74 u[IU]/mL (ref 0.450–4.500)

## 2024-03-07 ENCOUNTER — Encounter (INDEPENDENT_AMBULATORY_CARE_PROVIDER_SITE_OTHER): Payer: Self-pay

## 2024-03-14 ENCOUNTER — Ambulatory Visit (INDEPENDENT_AMBULATORY_CARE_PROVIDER_SITE_OTHER): Admitting: Internal Medicine

## 2024-03-21 ENCOUNTER — Other Ambulatory Visit (INDEPENDENT_AMBULATORY_CARE_PROVIDER_SITE_OTHER): Payer: Self-pay | Admitting: Internal Medicine

## 2024-03-21 ENCOUNTER — Telehealth (INDEPENDENT_AMBULATORY_CARE_PROVIDER_SITE_OTHER): Payer: Self-pay

## 2024-03-21 ENCOUNTER — Telehealth (INDEPENDENT_AMBULATORY_CARE_PROVIDER_SITE_OTHER): Payer: Self-pay | Admitting: Internal Medicine

## 2024-03-21 ENCOUNTER — Encounter (INDEPENDENT_AMBULATORY_CARE_PROVIDER_SITE_OTHER): Payer: Self-pay

## 2024-03-21 ENCOUNTER — Ambulatory Visit (HOSPITAL_BASED_OUTPATIENT_CLINIC_OR_DEPARTMENT_OTHER): Payer: Self-pay | Admitting: Family Medicine

## 2024-03-21 DIAGNOSIS — E66813 Obesity, class 3: Secondary | ICD-10-CM

## 2024-03-21 DIAGNOSIS — R7303 Prediabetes: Secondary | ICD-10-CM

## 2024-03-21 DIAGNOSIS — G4733 Obstructive sleep apnea (adult) (pediatric): Secondary | ICD-10-CM

## 2024-03-21 MED ORDER — PHENTERMINE HCL 37.5 MG PO TABS: 18.7500 mg | ORAL_TABLET | Freq: Every day | ORAL | 0 refills | Status: DC

## 2024-03-21 MED ORDER — TOPIRAMATE 25 MG PO TABS: 25.0000 mg | ORAL_TABLET | Freq: Every day | ORAL | 0 refills | Status: DC

## 2024-03-21 NOTE — Telephone Encounter (Signed)
 Pt called in with her insurance provider on the line requesting that a PA for topiramate  be resubmitted. They also asked that any medical records be submitted if the pt has tried and failed taking Diethylpropion tablets or the phendimetrazine extended release ER tablet. Please follow up with the patient.

## 2024-03-21 NOTE — Progress Notes (Signed)
 Patient is not able to afford brand-name Qsymia  and generic Qsymia  is in backorder she will therefore be prescribed individual agents off label.  Patient aware about off label use of medications.  She will be prescribed phentermine  18.75 mg in the morning and topiramate  25 mg in the evening.  Topiramate  will be then increased to 50 mg

## 2024-03-26 ENCOUNTER — Other Ambulatory Visit (INDEPENDENT_AMBULATORY_CARE_PROVIDER_SITE_OTHER): Payer: Self-pay | Admitting: Internal Medicine

## 2024-03-26 DIAGNOSIS — Z6841 Body Mass Index (BMI) 40.0 and over, adult: Secondary | ICD-10-CM

## 2024-03-26 DIAGNOSIS — G4733 Obstructive sleep apnea (adult) (pediatric): Secondary | ICD-10-CM

## 2024-03-29 ENCOUNTER — Ambulatory Visit (INDEPENDENT_AMBULATORY_CARE_PROVIDER_SITE_OTHER): Admitting: Internal Medicine

## 2024-03-29 ENCOUNTER — Encounter (INDEPENDENT_AMBULATORY_CARE_PROVIDER_SITE_OTHER): Payer: Self-pay | Admitting: Internal Medicine

## 2024-03-29 VITALS — BP 135/78 | HR 92 | Temp 98.1°F | Ht 68.0 in | Wt 313.0 lb

## 2024-03-29 DIAGNOSIS — R638 Other symptoms and signs concerning food and fluid intake: Secondary | ICD-10-CM

## 2024-03-29 DIAGNOSIS — E66813 Obesity, class 3: Secondary | ICD-10-CM

## 2024-03-29 DIAGNOSIS — R7303 Prediabetes: Secondary | ICD-10-CM | POA: Diagnosis not present

## 2024-03-29 DIAGNOSIS — G4733 Obstructive sleep apnea (adult) (pediatric): Secondary | ICD-10-CM | POA: Diagnosis not present

## 2024-03-29 DIAGNOSIS — Z6841 Body Mass Index (BMI) 40.0 and over, adult: Secondary | ICD-10-CM

## 2024-03-29 MED ORDER — TOPIRAMATE 25 MG PO TABS: 25.0000 mg | ORAL_TABLET | Freq: Every day | ORAL | 0 refills | Status: DC

## 2024-03-29 MED ORDER — WEGOVY 1 MG/0.5ML ~~LOC~~ SOAJ: 1.0000 mg | SUBCUTANEOUS | 0 refills | Status: DC

## 2024-03-29 MED ORDER — PHENTERMINE HCL 37.5 MG PO TABS: 18.7500 mg | ORAL_TABLET | Freq: Every day | ORAL | 0 refills | Status: DC

## 2024-03-29 NOTE — Progress Notes (Signed)
 Office: [phone]  /  Fax: [phone]  Weight Summary and Body Composition Analysis (BIA)  Vitals Temp: 98.1 F (36.7 C) BP: 135/78 Pulse Rate: 92 SpO2: 98 %   Anthropometric Measurements Height: 5' 8 (1.727 m) Weight: (!) 313 lb (142 kg) BMI (Calculated): 47.6 Weight at Last Visit: 317 lb Weight Lost Since Last Visit: 4 lb Weight Gained Since Last Visit: 0 lb Starting Weight: 260 lb Total Weight Loss (lbs): 47 lb (21.3 kg) Peak Weight: 361 lb   Body Composition  Body Fat %: 52.6 % Fat Mass (lbs): 164.8 lbs Muscle Mass (lbs): 141 lbs Total Body Water (lbs): 103.2 lbs Visceral Fat Rating : 18    RMR: 2102  Today's Visit #: 17  No data recorded  Subjective   Chief Complaint: Obesity  Interval History Discussed the use of AI scribe software for clinical note transcription with the patient, who gave verbal consent to proceed.  History of Present Illness   Lindsey Bowman is a 48 year old female who presents for medical weight management.  She has lost four pounds since her last office visit. She is following a 1500 calorie diet plan, although her adherence is suboptimal. She exercises three days a week for twenty minutes each session.  She is currently taking phentermine  and topiramate  separately, which she finds challenging to manage. She started phentermine  around March 23, 2024, and it has helped with 'food noise' and hunger. She is also on Wegovy , in her second month of the 0.5 mg dose, which makes her feel 'comatose' the day of and after administration. No constipation, nausea, or vomiting reported.  We had to overlap medications due to significant increases in hunger and weight gain after coming off Qsymia .  She previously transitioned from Qsymia  to Wegovy . She is concerned about potential weight regain and is focused on maintaining her current progress.  She receives support from friends and family, including financial assistance from a friend  who paid her utility bills. 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 Her son is doing well and helps neighbors for pay.  She sometimes forgets to take her medications, either phentermine  or topiramate , but feels she is managing well overall. No side effects from her current medications and she is sleeping well. She is consuming fruits and vegetables, though she admits she could increase her intake, and she is working on drinking more water.       Challenges affecting patient progress: strong hunger signals and/or impaired satiety / inhibitory control.    Pharmacotherapy for weight management: She is currently taking Wegovy  with adequate clinical response  and without side effects. and Qsymia  with adequate clinical response  and without side effects..  Currently therapeutic overlap goal would be to reduce and stop combination phentermine  and topiramate  once we get her to a therapeutic dose of Wegovy .  Assessment and Plan   Treatment Plan For Obesity:  Recommended Dietary Goals  Lindsey Bowman is currently in the action stage of change. As such, her goal is to continue weight management plan. She has agreed to: continue current plan  Behavioral Health and Counseling  We discussed the following behavioral modification strategies today: continue to work on maintaining a reduced calorie state, getting the recommended amount of protein, incorporating whole foods, making healthy choices, staying well hydrated and practicing mindfulness when eating..  Additional education and resources provided today: None  Recommended Physical Activity Goals  Lindsey Bowman has been advised  to work up to 150 minutes of moderate intensity aerobic activity a week and strengthening exercises 2-3 times per week for cardiovascular health, weight loss maintenance and preservation of muscle mass.   She has agreed to :  Think about enjoyable ways to increase daily physical  activity and overcoming barriers to exercise and Increase physical activity in their day and reduce sedentary time (increase NEAT).  Medical Interventions and Pharmacotherapy  We discussed various medication options to help Lindsey Bowman with her weight loss efforts and we both agreed to : We will increase Wegovyto 1 mg once a week.  She will continue on phentermine  18.75 mg in the morning and topiramate  25 mg in the evening off label due to cost of brand-name drug.  The goal will be to discontinue combination once we get her to a therapeutic dose of Wegovy  as she is transitioning from Qsymia .  Associated Conditions Impacted by Obesity Treatment  Assessment & Plan Class 3 severe obesity with serious comorbidity and body mass index (BMI) of 50.0 to 59.9 in adult, unspecified obesity type She is undergoing medical weight management, having lost four pounds since the last visit. She follows a 1500 calorie diet with suboptimal adherence and exercises three days a week for 20 minutes. She is on phentermine  and topiramate , which she sometimes forgets to take, with no reported side effects. She is also on Wegovy , currently at 0.5 mg, covered by insurance, and reports improved appetite suppression and reduced portion sizes. The plan is to increase Wegovy  to 1 mg and eventually to 1.7 mg, where more significant appetite suppression is expected. She expresses concern about weight regain, but reassurance is given that the current regimen is effective. Discussion included the hungry brain and hungry gut phenotypes, with the latter responding well to GLP-1s like Wegovy . At 1.7 mg, more noticeable appetite suppression is anticipated, with potential to discontinue phentermine  and topiramate  if effective. - Continue phentermine  and topiramate  as prescribed. - Increase Wegovy  to 1 mg once a week, with a plan to increase to 1.7 mg after one month. - Ensure adequate water and fiber intake to prevent constipation. - Schedule  follow-up in four weeks for medication adjustment. Prediabetes Most recent A1c is  Lab Results  Component Value Date   HGBA1C 5.6 03/02/2024   HGBA1C 6.4 (H) 11/04/2022   Her most recent A1c was 5.6 and improved.  She did not tolerate metformin  in the past.  She is not on Wegovy  medication will be increased to 1 mg once a week OSA (obstructive sleep apnea) On CPAP with reported good compliance. Continue PAP therapy. Losing 15% or more of body weight may improve AHI.  She has lost 13% of total body weight medically supervised weight management plan.  She is now on Wegovy  medication will be increased to 1 mg once a week  Abnormal food appetite Improved on combination phentermine  and topiramate .  She is also on Wegovy   She has increased orexigenic signaling, impaired satiety and inhibitory control. This is secondary to an abnormal energy regulation system and pathological neurohormonal pathways characteristic of excess adiposity.  In addition to nutritional and behavioral strategies she benefits from ongoing pharmacotherapy.  We will increase Wegovy  to 1 mg once a week continue phentermine  and topiramate  off label the latter will be discontinued once she gets to a therapeutic dose of Wegovy     General Health Maintenance Advised to maintain a balanced diet with adequate protein, fruits, and vegetables. Encouraged to drink more water and avoid skipping meals to  support weight management goals. - Encourage balanced diet with adequate protein, fruits, and vegetables. - Increase water intake. - Avoid skipping meals.        Objective   Physical Exam:  Blood pressure 135/78, pulse 92, temperature 98.1 F (36.7 C), height 5' 8 (1.727 m), weight (!) 313 lb (142 kg), last menstrual period 02/07/2024, SpO2 98%. Body mass index is 47.59 kg/m.  General: She is overweight, cooperative, alert, well developed, and in no acute distress. PSYCH: Has normal mood, affect and thought process.   HEENT:  EOMI, sclerae are anicteric. Lungs: Normal breathing effort, no conversational dyspnea. Extremities: No edema.  Neurologic: No gross sensory or motor deficits. No tremors or fasciculations noted.    Diagnostic Data Reviewed:  BMET    Component Value Date/Time   NA 139 03/02/2024 1052   K 4.4 03/02/2024 1052   CL 102 03/02/2024 1052   CO2 22 03/02/2024 1052   GLUCOSE 92 03/02/2024 1052   GLUCOSE 91 05/11/2009 0257   BUN 6 03/02/2024 1052   CREATININE 0.81 03/02/2024 1052   CALCIUM 9.4 03/02/2024 1052   GFRNONAA >60 05/11/2009 0257   GFRAA  05/11/2009 0257    >60        The eGFR has been calculated using the MDRD equation. This calculation has not been validated in all clinical situations. eGFR's persistently <60 mL/min signify possible Chronic Kidney Disease.   Lab Results  Component Value Date   HGBA1C 5.6 03/02/2024   HGBA1C 6.4 (H) 11/04/2022   Lab Results  Component Value Date   INSULIN  35.8 (H) 10/12/2023   INSULIN  57.0 (H) 11/04/2022   Lab Results  Component Value Date   TSH 1.740 03/02/2024   CBC    Component Value Date/Time   WBC 6.0 03/02/2024 1052   WBC 9.2 05/12/2009 0620   RBC 4.49 03/02/2024 1052   RBC 3.35 (L) 05/12/2009 0620   HGB 13.2 03/02/2024 1052   HCT 42.4 03/02/2024 1052   PLT 423 03/02/2024 1052   MCV 94 03/02/2024 1052   MCH 29.4 03/02/2024 1052   MCHC 31.1 (L) 03/02/2024 1052   MCHC 34.3 05/12/2009 0620   RDW 12.2 03/02/2024 1052   Iron Studies No results found for: IRON, TIBC, FERRITIN, IRONPCTSAT Lipid Panel     Component Value Date/Time   CHOL 152 03/02/2024 1052   TRIG 63 03/02/2024 1052   HDL 58 03/02/2024 1052   CHOLHDL 2.6 03/02/2024 1052   CHOLHDL 2.5 Ratio 09/02/2007 2043   VLDL 20 09/02/2007 2043   LDLCALC 81 03/02/2024 1052   Hepatic Function Panel     Component Value Date/Time   PROT 7.1 03/02/2024 1052   ALBUMIN 4.1 03/02/2024 1052   AST 20 03/02/2024 1052   ALT 18 03/02/2024 1052   ALKPHOS  51 03/02/2024 1052   BILITOT 0.3 03/02/2024 1052   BILIDIR <0.1 mg/dL 87/73/7991 7956   IBILI NOT CALC mg/dL 87/73/7991 7956      Component Value Date/Time   TSH 1.740 03/02/2024 1052   TSH 1.760 11/04/2022 0829   Nutritional Lab Results  Component Value Date   VD25OH 27.3 (L) 03/02/2024   VD25OH 30.5 03/04/2023   VD25OH 22.5 (L) 11/04/2022    Medications: Outpatient Encounter Medications as of 03/29/2024  Medication Sig   Semaglutide -Weight Management (WEGOVY ) 1 MG/0.5ML SOAJ Inject 1 mg into the skin once a week.   [DISCONTINUED] phentermine  (ADIPEX-P ) 37.5 MG tablet Take 0.5 tablets (18.75 mg total) by mouth daily before breakfast.   [  DISCONTINUED] Semaglutide -Weight Management (WEGOVY ) 0.5 MG/0.5ML SOAJ Inject 0.5 mg into the skin once a week.   [DISCONTINUED] topiramate  (TOPAMAX ) 25 MG tablet Take 1 tablet (25 mg total) by mouth daily.   [START ON 04/21/2024] phentermine  (ADIPEX-P ) 37.5 MG tablet Take 0.5 tablets (18.75 mg total) by mouth daily before breakfast.   [START ON 04/21/2024] topiramate  (TOPAMAX ) 25 MG tablet Take 1 tablet (25 mg total) by mouth daily.   No facility-administered encounter medications on file as of 03/29/2024.     Follow-Up   Return in about 4 weeks (around 04/26/2024) for For Weight Mangement with Dr. Francyne.SABRA She was informed of the importance of frequent follow up visits to maximize her success with intensive lifestyle modifications for her multiple health conditions.  Attestation Statement   Reviewed by clinician on day of visit: allergies, medications, problem list, medical history, surgical history, family history, social history, and previous encounter notes.     Lucas Francyne, MD

## 2024-03-29 NOTE — Assessment & Plan Note (Signed)
 On CPAP with reported good compliance. Continue PAP therapy. Losing 15% or more of body weight may improve AHI.  She has lost 13% of total body weight medically supervised weight management plan.  She is now on Wegovy  medication will be increased to 1 mg once a week

## 2024-03-29 NOTE — Assessment & Plan Note (Signed)
 Most recent A1c is  Lab Results  Component Value Date   HGBA1C 5.6 03/02/2024   HGBA1C 6.4 (H) 11/04/2022   Her most recent A1c was 5.6 and improved.  She did not tolerate metformin  in the past.  She is not on Wegovy  medication will be increased to 1 mg once a week

## 2024-03-29 NOTE — Assessment & Plan Note (Signed)
 Improved on combination phentermine  and topiramate .  She is also on Wegovy   She has increased orexigenic signaling, impaired satiety and inhibitory control. This is secondary to an abnormal energy regulation system and pathological neurohormonal pathways characteristic of excess adiposity.  In addition to nutritional and behavioral strategies she benefits from ongoing pharmacotherapy.  We will increase Wegovy  to 1 mg once a week continue phentermine  and topiramate  off label the latter will be discontinued once she gets to a therapeutic dose of Wegovy 

## 2024-03-29 NOTE — Assessment & Plan Note (Signed)
 She is undergoing medical weight management, having lost four pounds since the last visit. She follows a 1500 calorie diet with suboptimal adherence and exercises three days a week for 20 minutes. She is on phentermine  and topiramate , which she sometimes forgets to take, with no reported side effects. She is also on Wegovy , currently at 0.5 mg, covered by insurance, and reports improved appetite suppression and reduced portion sizes. The plan is to increase Wegovy  to 1 mg and eventually to 1.7 mg, where more significant appetite suppression is expected. She expresses concern about weight regain, but reassurance is given that the current regimen is effective. Discussion included the hungry brain and hungry gut phenotypes, with the latter responding well to GLP-1s like Wegovy . At 1.7 mg, more noticeable appetite suppression is anticipated, with potential to discontinue phentermine  and topiramate  if effective. - Continue phentermine  and topiramate  as prescribed. - Increase Wegovy  to 1 mg once a week, with a plan to increase to 1.7 mg after one month. - Ensure adequate water and fiber intake to prevent constipation. - Schedule follow-up in four weeks for medication adjustment.

## 2024-04-11 NOTE — Telephone Encounter (Signed)
 error

## 2024-04-28 ENCOUNTER — Other Ambulatory Visit (INDEPENDENT_AMBULATORY_CARE_PROVIDER_SITE_OTHER): Payer: Self-pay | Admitting: Internal Medicine

## 2024-04-28 DIAGNOSIS — R7303 Prediabetes: Secondary | ICD-10-CM

## 2024-04-28 DIAGNOSIS — Z6841 Body Mass Index (BMI) 40.0 and over, adult: Secondary | ICD-10-CM

## 2024-04-28 DIAGNOSIS — G4733 Obstructive sleep apnea (adult) (pediatric): Secondary | ICD-10-CM

## 2024-05-02 ENCOUNTER — Ambulatory Visit (INDEPENDENT_AMBULATORY_CARE_PROVIDER_SITE_OTHER): Admitting: Internal Medicine

## 2024-05-02 ENCOUNTER — Encounter (INDEPENDENT_AMBULATORY_CARE_PROVIDER_SITE_OTHER): Payer: Self-pay | Admitting: Internal Medicine

## 2024-05-02 VITALS — BP 117/82 | HR 68 | Temp 98.4°F | Ht 68.0 in | Wt 309.0 lb

## 2024-05-02 DIAGNOSIS — Z6841 Body Mass Index (BMI) 40.0 and over, adult: Secondary | ICD-10-CM

## 2024-05-02 DIAGNOSIS — E66813 Obesity, class 3: Secondary | ICD-10-CM

## 2024-05-02 DIAGNOSIS — G4733 Obstructive sleep apnea (adult) (pediatric): Secondary | ICD-10-CM

## 2024-05-02 DIAGNOSIS — R7303 Prediabetes: Secondary | ICD-10-CM | POA: Diagnosis not present

## 2024-05-02 MED ORDER — WEGOVY 1.7 MG/0.75ML ~~LOC~~ SOAJ: 1.7000 mg | SUBCUTANEOUS | 0 refills | Status: DC

## 2024-05-02 NOTE — Assessment & Plan Note (Signed)
 Weight: decrease of 57 lb (15.6%) over 1 year, 4 months  Start: 12/10/2022 366 lb (166 kg) (H)  End: 05/02/2024 309 lb (140.2 kg) (H)  Obesity management is ongoing with significant weight loss achieved. Current weight is 309 pounds, down from 366 pounds, marking a 57-pound loss (15.6% reduction). She is not yet at the full therapeutic dose of Wegovy , which is expected to further aid in weight reduction. The goal is to reach a weight of 180 pounds. She is aware that weight management is a long-term process and that the body will naturally try to regain weight. The importance of maintaining a diet rich in fiber, water, and protein to promote satiety and counteract the body's tendency to regain weight was emphasized. She is informed that Wegovy  may provide an additional 12% weight loss when the dose is increased to 1.7 mg. - Increase Wegovy  dose to 1.7 mg. - Discontinue phentermine  and topiramate . - Plan to resume Qsymia  if a plateau is reached with Wegovy . - Reinforce dietary principles: increase intake of water, fiber, and protein. - Schedule follow-up in four weeks to assess response to increased Wegovy  dose.

## 2024-05-02 NOTE — Progress Notes (Unsigned)
 Office: [phone]  /  Fax: [phone]  Weight Summary and Body Composition Analysis (BIA)  Vitals Temp: 98.4 F (36.9 C) BP: 117/82 Pulse Rate: 68 SpO2: 99 %   Anthropometric Measurements Height: 5' 8 (1.727 m) Weight: (!) 309 lb (140.2 kg) BMI (Calculated): 46.99 Weight at Last Visit: 313lb Weight Lost Since Last Visit: 4lb Weight Gained Since Last Visit: 0lb Starting Weight: 260lb Total Weight Loss (lbs): 51 lb (23.1 kg) Peak Weight: 361lb   Body Composition  Body Fat %: 52.4 % Fat Mass (lbs): 162.2 lbs Muscle Mass (lbs): 139.8 lbs Total Body Water (lbs): 104.4 lbs Visceral Fat Rating : 18    RMR: 2102  Today's Visit #: 18  No data recorded  Subjective   Chief Complaint: Obesity  Interval History Discussed the use of AI scribe software for clinical note transcription with the patient, who gave verbal consent to proceed.  History of Present Illness Lindsey Bowman is a 48 year old female who presents for weight management follow-up.  Since last office visit Lindsey Bowman has lost 4 pounds Lindsey Bowman reports following a 1500-calorie nutrition plan 40% of the time Lindsey Bowman is not tracking Lindsey Bowman reports eating more whole foods getting the recommended amount of protein occasionally skipping meals exercising 3 days a week 30 minutes.  Lindsey Bowman has lost 57 pounds, currently weighing 309 pounds, down from 366 pounds. Lindsey Bowman experiences manageable nausea, particularly when thinking about eating or when uncertain about what to eat. No constipation or vomiting.  Her current medications include Wegovy . Lindsey Bowman has recently stopped taking phentermine  and topiramate . Lindsey Bowman does not pay for her medications, including Wegovy , phentermine , and topiramate .  Lindsey Bowman is making efforts to improve her diet by drinking more water and avoiding sugar. Lindsey Bowman recently turned down chocolate cake, which Lindsey Bowman found challenging but ultimately rewarding.  Lindsey Bowman is concerned about her 78 year old son, who is prediabetic. He  has a high sugar intake and difficulty managing his diet. There is a family history of diabetes, including his father. Lindsey Bowman is trying to encourage healthier eating habits and physical activity for him.    Challenges affecting patient progress: none.    Pharmacotherapy for weight management: Lindsey Bowman is currently taking Wegovy  {emsideeffects:29751::with adequate clinical response ,without side effects.}, Topiramate  (off label use, single agent) {emsideeffects:29751::with adequate clinical response ,without side effects.}, and Phentermine  (longterm use, off-label, single agent)  {emsideeffects:29751::with adequate clinical response ,without side effects.}.   Assessment and Plan   Treatment Plan For Obesity:  Recommended Dietary Goals  Lindsey Bowman is currently in the action stage of change. As such, her goal is to continue weight management plan. Lindsey Bowman has agreed to: {EMWTLOSSPLAN:29297::continue current plan}  Behavioral Health and Counseling  We discussed the following behavioral modification strategies today: {EMWMwtlossstrategies:28914::continue to work on maintaining a reduced calorie state, getting the recommended amount of protein, incorporating whole foods, making healthy choices, staying well hydrated and practicing mindfulness when eating.,increase protein intake, fibrous foods (25 grams per day for women, 30 grams for men) and water to improve satiety and decrease hunger signals. }.  Additional education and resources provided today: {EMadditionalresources:29169::None}  Recommended Physical Activity Goals  Lindsey Bowman has been advised to work up to 150 minutes of moderate intensity aerobic activity a week and strengthening exercises 2-3 times per week for cardiovascular health, weight loss maintenance and preservation of muscle mass.  Lindsey Bowman has agreed to :  {EMEXERCISE:28847::Think about enjoyable ways to increase daily physical activity and overcoming barriers to  exercise,Increase physical activity in their day and reduce sedentary  time (increase NEAT).,Increase volume of physical activity to a goal of 240 minutes a week,Combine aerobic and strengthening exercises for efficiency and improved cardiometabolic health.}  Medical Interventions and Pharmacotherapy  We discussed various medication options to help Lindsey Bowman with her weight loss efforts and we both agreed to : {EMagreedrx:29170}  Associated Conditions Impacted by Obesity Treatment  Assessment & Plan Class 3 severe obesity with serious comorbidity and body mass index (BMI) of 45.0 to 49.9 in adult, unspecified obesity type Weight: decrease of 57 lb (15.6%) over 1 year, 4 months  Start: 12/10/2022 366 lb (166 kg) (H)  End: 05/02/2024 309 lb (140.2 kg) (H)   OSA (obstructive sleep apnea)  Prediabetes     Assessment and Plan Assessment & Plan Obesity Obesity management is ongoing with significant weight loss achieved. Current weight is 309 pounds, down from 366 pounds, marking a 57-pound loss (15.6% reduction). Lindsey Bowman is not yet at the full therapeutic dose of Wegovy , which is expected to further aid in weight reduction. The goal is to reach a weight of 180 pounds. Lindsey Bowman is aware that weight management is a long-term process and that the body will naturally try to regain weight. The importance of maintaining a diet rich in fiber, water, and protein to promote satiety and counteract the body's tendency to regain weight was emphasized. Lindsey Bowman is informed that Wegovy  may provide an additional 12% weight loss when the dose is increased to 1.7 mg. - Increase Wegovy  dose to 1.7 mg. - Discontinue phentermine  and topiramate . - Plan to resume Qsymia  if a plateau is reached with Wegovy . - Reinforce dietary principles: increase intake of water, fiber, and protein. - Schedule follow-up in four weeks to assess response to increased Wegovy  dose.  Nausea related to anti-obesity medication Mild nausea associated  with Wegovy , not severe enough to be unmanageable. Nausea occurs occasionally when thinking about food or when uncertain about what to eat. Lindsey Bowman is aware that this is a common side effect of the medication. - Monitor nausea symptoms and manage as needed.      Objective   Physical Exam:  Blood pressure 117/82, pulse 68, temperature 98.4 F (36.9 C), height 5' 8 (1.727 m), weight (!) 309 lb (140.2 kg), SpO2 99%. Body mass index is 46.98 kg/m.  General: Lindsey Bowman is overweight, cooperative, alert, well developed, and in no acute distress. PSYCH: Has normal mood, affect and thought process.   HEENT: EOMI, sclerae are anicteric. Lungs: Normal breathing effort, no conversational dyspnea. Extremities: No edema.  Neurologic: No gross sensory or motor deficits. No tremors or fasciculations noted.    Diagnostic Data Reviewed:  BMET    Component Value Date/Time   NA 139 03/02/2024 1052   K 4.4 03/02/2024 1052   CL 102 03/02/2024 1052   CO2 22 03/02/2024 1052   GLUCOSE 92 03/02/2024 1052   GLUCOSE 91 05/11/2009 0257   BUN 6 03/02/2024 1052   CREATININE 0.81 03/02/2024 1052   CALCIUM 9.4 03/02/2024 1052   GFRNONAA >60 05/11/2009 0257   GFRAA  05/11/2009 0257    >60        The eGFR has been calculated using the MDRD equation. This calculation has not been validated in all clinical situations. eGFR's persistently <60 mL/min signify possible Chronic Kidney Disease.   Lab Results  Component Value Date   HGBA1C 5.6 03/02/2024   HGBA1C 6.4 (H) 11/04/2022   Lab Results  Component Value Date   INSULIN  35.8 (H) 10/12/2023   INSULIN  57.0 (H) 11/04/2022  Lab Results  Component Value Date   TSH 1.740 03/02/2024   CBC    Component Value Date/Time   WBC 6.0 03/02/2024 1052   WBC 9.2 05/12/2009 0620   RBC 4.49 03/02/2024 1052   RBC 3.35 (L) 05/12/2009 0620   HGB 13.2 03/02/2024 1052   HCT 42.4 03/02/2024 1052   PLT 423 03/02/2024 1052   MCV 94 03/02/2024 1052   MCH 29.4  03/02/2024 1052   MCHC 31.1 (L) 03/02/2024 1052   MCHC 34.3 05/12/2009 0620   RDW 12.2 03/02/2024 1052   Iron Studies No results found for: IRON, TIBC, FERRITIN, IRONPCTSAT Lipid Panel     Component Value Date/Time   CHOL 152 03/02/2024 1052   TRIG 63 03/02/2024 1052   HDL 58 03/02/2024 1052   CHOLHDL 2.6 03/02/2024 1052   CHOLHDL 2.5 Ratio 09/02/2007 2043   VLDL 20 09/02/2007 2043   LDLCALC 81 03/02/2024 1052   Hepatic Function Panel     Component Value Date/Time   PROT 7.1 03/02/2024 1052   ALBUMIN 4.1 03/02/2024 1052   AST 20 03/02/2024 1052   ALT 18 03/02/2024 1052   ALKPHOS 51 03/02/2024 1052   BILITOT 0.3 03/02/2024 1052   BILIDIR <0.1 mg/dL 87/73/7991 7956   IBILI NOT CALC mg/dL 87/73/7991 7956      Component Value Date/Time   TSH 1.740 03/02/2024 1052   TSH 1.760 11/04/2022 0829   Nutritional Lab Results  Component Value Date   VD25OH 27.3 (L) 03/02/2024   VD25OH 30.5 03/04/2023   VD25OH 22.5 (L) 11/04/2022    Medications: Outpatient Encounter Medications as of 05/02/2024  Medication Sig   phentermine  (ADIPEX-P ) 37.5 MG tablet Take 0.5 tablets (18.75 mg total) by mouth daily before breakfast.   semaglutide -weight management (WEGOVY ) 1.7 MG/0.75ML SOAJ SQ injection Inject 1.7 mg into the skin once a week.   topiramate  (TOPAMAX ) 25 MG tablet Take 1 tablet (25 mg total) by mouth daily.   [DISCONTINUED] Semaglutide -Weight Management (WEGOVY ) 1 MG/0.5ML SOAJ Inject 1 mg into the skin once a week.   No facility-administered encounter medications on file as of 05/02/2024.     Follow-Up   Return in about 4 weeks (around 05/30/2024) for For Weight Mangement with Dr. Francyne.SABRA Lindsey Bowman was informed of the importance of frequent follow up visits to maximize her success with intensive lifestyle modifications for her multiple health conditions.  Attestation Statement   Reviewed by clinician on day of visit: allergies, medications, problem list, medical  history, surgical history, family history, social history, and previous encounter notes.     Lucas Francyne, MD

## 2024-05-03 NOTE — Assessment & Plan Note (Signed)
 Most recent A1c is  Lab Results  Component Value Date   HGBA1C 5.6 03/02/2024   HGBA1C 6.4 (H) 11/04/2022   Her most recent A1c was 5.6 and improved.  She did not tolerate metformin  in the past.  Increase Wegovy  to 1.7 mg once a week

## 2024-05-03 NOTE — Assessment & Plan Note (Signed)
 On CPAP with reported good compliance. Continue PAP therapy. Losing 15% or more of body weight may improve AHI.  She has lost 15.6% of total body weight medically supervised weight management plan.  She is now on Wegovy  medication will be increased to 1.7 mg once a week

## 2024-05-22 ENCOUNTER — Encounter (HOSPITAL_BASED_OUTPATIENT_CLINIC_OR_DEPARTMENT_OTHER): Admitting: Family Medicine

## 2024-06-01 ENCOUNTER — Ambulatory Visit (INDEPENDENT_AMBULATORY_CARE_PROVIDER_SITE_OTHER): Admitting: Internal Medicine

## 2024-06-01 ENCOUNTER — Encounter (INDEPENDENT_AMBULATORY_CARE_PROVIDER_SITE_OTHER): Payer: Self-pay | Admitting: Internal Medicine

## 2024-06-01 VITALS — BP 115/81 | HR 99 | Temp 98.2°F | Ht 68.0 in | Wt 306.0 lb

## 2024-06-01 DIAGNOSIS — E66813 Obesity, class 3: Secondary | ICD-10-CM | POA: Diagnosis not present

## 2024-06-01 DIAGNOSIS — R110 Nausea: Secondary | ICD-10-CM

## 2024-06-01 DIAGNOSIS — R7303 Prediabetes: Secondary | ICD-10-CM

## 2024-06-01 DIAGNOSIS — Z6841 Body Mass Index (BMI) 40.0 and over, adult: Secondary | ICD-10-CM

## 2024-06-01 DIAGNOSIS — G4733 Obstructive sleep apnea (adult) (pediatric): Secondary | ICD-10-CM | POA: Diagnosis not present

## 2024-06-01 MED ORDER — WEGOVY 1.7 MG/0.75ML ~~LOC~~ SOAJ: 1.7000 mg | SUBCUTANEOUS | 0 refills | Status: DC

## 2024-06-01 MED ORDER — ONDANSETRON 4 MG PO TBDP: 4.0000 mg | ORAL_TABLET | Freq: Three times a day (TID) | ORAL | 0 refills | Status: DC | PRN

## 2024-06-01 NOTE — Progress Notes (Signed)
 "  Office: [phone]  /  Fax: [phone]  Weight Summary and Body Composition Analysis (BIA)  Vitals Temp: 98.2 F (36.8 C) BP: 115/81 Pulse Rate: 99 SpO2: 98 %   Anthropometric Measurements Height: 5' 8 (1.727 m) Weight: (!) 306 lb (138.8 kg) BMI (Calculated): 46.54 Weight at Last Visit: 309 lb Weight Lost Since Last Visit: 3 lb Weight Gained Since Last Visit: 0 lb Starting Weight: 360 lb Total Weight Loss (lbs): 54 lb (24.5 kg) Peak Weight: 361 lb   Body Composition  Body Fat %: 51.1 % Fat Mass (lbs): 156.4 lbs Muscle Mass (lbs): 142 lbs Total Body Water (lbs): 99.6 lbs Visceral Fat Rating : 17    No data recorded No data recorded No data recorded  Subjective   Chief Complaint: Obesity  Interval History Discussed the use of AI scribe software for clinical note transcription with the patient, who gave verbal consent to proceed.  History of Present Illness A 48 year old female who presents for medical weight management.  Since the last visit, she has lost three pounds, totaling fifty-four pounds of weight loss. She adheres to a 1500 calorie nutrition plan 80% of the time and exercises twice a week for 15-20 minutes. Her current calorie intake target is 1500.  She experiences nausea, particularly in the first few days after taking Wegovy , described as 'sickening' but not leading to vomiting. Fatigue and sleepiness are noted, especially initially, sometimes resulting in daytime sleep. She has been on the current dose of 1.7 mg for two weeks. She has tried taking the shot at night to reduce daytime sleepiness but still experiences morning nausea. Constipation is not reported.  During her menstrual cycle, she had increased cravings for sweets, which is unusual while on the medication. She does not take topiramate  or phentermine , only the Wegovy  shot.  She has joined a gym with a pool, covered by her insurance, and plans to attend despite her son's reluctance to  join. She participates in a live exercise class with a friend, which she finds enjoyable.     Challenges affecting patient progress: none.    Pharmacotherapy for weight management: She is currently taking Wegovy  with adequate clinical response  and experiencing the following side effects: nausea and some fatigue.   Assessment and Plan   Treatment Plan For Obesity:  Recommended Dietary Goals  Auriana is currently in the action stage of change. As such, her goal is to continue weight management plan. She has agreed to: continue current plan  Behavioral Health and Counseling  We discussed the following behavioral modification strategies today: continue to work on maintaining a reduced calorie state, getting the recommended amount of protein, incorporating whole foods, making healthy choices, staying well hydrated and practicing mindfulness when eating. and increase protein intake, fibrous foods (25 grams per day for women, 30 grams for men) and water to improve satiety and decrease hunger signals. .  Additional education and resources provided today: None  Recommended Physical Activity Goals  Wanona has been advised to work up to 150 minutes of moderate intensity aerobic activity a week and strengthening exercises 2-3 times per week for cardiovascular health, weight loss maintenance and preservation of muscle mass.  She has agreed to :  Think about enjoyable ways to increase daily physical activity and overcoming barriers to exercise, Increase physical activity in their day and reduce sedentary time (increase NEAT)., Increase volume of physical activity to a goal of 240 minutes a week, and Combine aerobic and strengthening exercises  for efficiency and improved cardiometabolic health.  Medical Interventions and Pharmacotherapy  We discussed various medication options to help Amyre with her weight loss efforts and we both agreed to : Adequate clinical response to anti-obesity medication,  continue current regimen and do not recommend further increases in GLP-1 due to gastrointestinal side effects  Associated Conditions Impacted by Obesity Treatment  Assessment & Plan Nausea Nausea and fatigue secondary to anti-obesity medication Nausea and fatigue secondary to Wegovy  (semaglutide ) use, particularly after dose increase to 1.7 mg. Nausea occurs mostly after administration, with queasiness but no vomiting. Fatigue noted, especially in the initial days post-injection. Ondansetron  (Zofran ) offered to manage nausea, with discussion on its side effects including dry mouth and mild drowsiness. Encouraged to use peppermint oil, peppermint tea, or ginger ale as alternative remedies. Plan to maintain current dose of 1.7 mg for another month to build tolerance before considering dose increase. - Prescribe ondansetron  for nausea, to be taken as needed, especially on the day of Wegovy  injection. - Maintain current Wegovy  dose at 1.7 mg for one month. - Encourage use of peppermint oil, peppermint tea, or ginger ale for nausea relief. Class 3 severe obesity with serious comorbidity and body mass index (BMI) of 45.0 to 49.9 in adult, unspecified obesity type Weight: decrease of 60 lb (16.4%) over 1 year, 5 months  Start: 12/10/2022 366 lb (166 kg) (H)  End: 06/01/2024 306 lb (138.8 kg) (H)  Obesity with prediabetes and insulin  resistance Ongoing weight loss journey with a total loss of 60 pounds over approximately 17 months. Current weight loss has slowed, possibly due to reaching caloric equilibrium, slowed metabolism, or reduced medication efficacy. Current caloric intake is 1500 calories, with a metabolic rate of 7899 calories. Exercise is currently 2 days a week, 15-20 minutes per session. Emphasis on increasing physical activity to 240 minutes per week to aid in weight loss and maintenance. Discussion on the importance of muscle activation and its benefits on metabolism and overall health. -  Continue 1500 calorie nutrition plan. - Increase physical activity to 240 minutes per week. - Encourage participation in group exercises or aquatic activities. - Monitor weight and adjust caloric intake as needed. OSA (obstructive sleep apnea) On CPAP with reported good compliance. Continue PAP therapy. Losing 15% or more of body weight may improve AHI.  She has lost 15.6% of total body weight medically supervised weight management plan.  She is now on Wegovy  medication will be increased to 1.7 mg once a week  Prediabetes Most recent A1c is  Lab Results  Component Value Date   HGBA1C 5.6 03/02/2024   HGBA1C 6.4 (H) 11/04/2022   Her most recent A1c was 5.6 and improved.  She did not tolerate metformin  in the past.  Continue Wegovy  for pharmacoprophylaxis.         Objective   Physical Exam:  Blood pressure 115/81, pulse 99, temperature 98.2 F (36.8 C), height 5' 8 (1.727 m), weight (!) 306 lb (138.8 kg), last menstrual period 05/25/2024, SpO2 98%. Body mass index is 46.53 kg/m.  General: She is overweight, cooperative, alert, well developed, and in no acute distress. PSYCH: Has normal mood, affect and thought process.   HEENT: EOMI, sclerae are anicteric. Lungs: Normal breathing effort, no conversational dyspnea. Extremities: No edema.  Neurologic: No gross sensory or motor deficits. No tremors or fasciculations noted.    Diagnostic Data Reviewed:  BMET    Component Value Date/Time   NA 139 03/02/2024 1052   K 4.4 03/02/2024 1052  CL 102 03/02/2024 1052   CO2 22 03/02/2024 1052   GLUCOSE 92 03/02/2024 1052   GLUCOSE 91 05/11/2009 0257   BUN 6 03/02/2024 1052   CREATININE 0.81 03/02/2024 1052   CALCIUM 9.4 03/02/2024 1052   GFRNONAA >60 05/11/2009 0257   GFRAA  05/11/2009 0257    >60        The eGFR has been calculated using the MDRD equation. This calculation has not been validated in all clinical situations. eGFR's persistently <60 mL/min  signify possible Chronic Kidney Disease.   Lab Results  Component Value Date   HGBA1C 5.6 03/02/2024   HGBA1C 6.4 (H) 11/04/2022   Lab Results  Component Value Date   INSULIN  35.8 (H) 10/12/2023   INSULIN  57.0 (H) 11/04/2022   Lab Results  Component Value Date   TSH 1.740 03/02/2024   CBC    Component Value Date/Time   WBC 6.0 03/02/2024 1052   WBC 9.2 05/12/2009 0620   RBC 4.49 03/02/2024 1052   RBC 3.35 (L) 05/12/2009 0620   HGB 13.2 03/02/2024 1052   HCT 42.4 03/02/2024 1052   PLT 423 03/02/2024 1052   MCV 94 03/02/2024 1052   MCH 29.4 03/02/2024 1052   MCHC 31.1 (L) 03/02/2024 1052   MCHC 34.3 05/12/2009 0620   RDW 12.2 03/02/2024 1052   Iron Studies No results found for: IRON, TIBC, FERRITIN, IRONPCTSAT Lipid Panel     Component Value Date/Time   CHOL 152 03/02/2024 1052   TRIG 63 03/02/2024 1052   HDL 58 03/02/2024 1052   CHOLHDL 2.6 03/02/2024 1052   CHOLHDL 2.5 Ratio 09/02/2007 2043   VLDL 20 09/02/2007 2043   LDLCALC 81 03/02/2024 1052   Hepatic Function Panel     Component Value Date/Time   PROT 7.1 03/02/2024 1052   ALBUMIN 4.1 03/02/2024 1052   AST 20 03/02/2024 1052   ALT 18 03/02/2024 1052   ALKPHOS 51 03/02/2024 1052   BILITOT 0.3 03/02/2024 1052   BILIDIR <0.1 mg/dL 87/73/7991 7956   IBILI NOT CALC mg/dL 87/73/7991 7956      Component Value Date/Time   TSH 1.740 03/02/2024 1052   TSH 1.760 11/04/2022 0829   Nutritional Lab Results  Component Value Date   VD25OH 27.3 (L) 03/02/2024   VD25OH 30.5 03/04/2023   VD25OH 22.5 (L) 11/04/2022    Medications: Outpatient Encounter Medications as of 06/01/2024  Medication Sig   ondansetron  (ZOFRAN -ODT) 4 MG disintegrating tablet Take 1 tablet (4 mg total) by mouth every 8 (eight) hours as needed.   semaglutide -weight management (WEGOVY ) 1.7 MG/0.75ML SOAJ SQ injection Inject 1.7 mg into the skin once a week.   [DISCONTINUED] phentermine  (ADIPEX-P ) 37.5 MG tablet Take 0.5 tablets  (18.75 mg total) by mouth daily before breakfast.   [DISCONTINUED] semaglutide -weight management (WEGOVY ) 1.7 MG/0.75ML SOAJ SQ injection Inject 1.7 mg into the skin once a week.   [DISCONTINUED] topiramate  (TOPAMAX ) 25 MG tablet Take 1 tablet (25 mg total) by mouth daily. (Patient not taking: Reported on 06/01/2024)   No facility-administered encounter medications on file as of 06/01/2024.     Follow-Up   Return in about 4 weeks (around 06/29/2024) for For Weight Mangement with Dr. Francyne.SABRA She was informed of the importance of frequent follow up visits to maximize her success with intensive lifestyle modifications for her multiple health conditions.  Attestation Statement   Reviewed by clinician on day of visit: allergies, medications, problem list, medical history, surgical history, family history, social history, and previous encounter  notes.     Lucas Parker, MD  "

## 2024-06-02 NOTE — Assessment & Plan Note (Signed)
 Most recent A1c is  Lab Results  Component Value Date   HGBA1C 5.6 03/02/2024   HGBA1C 6.4 (H) 11/04/2022   Her most recent A1c was 5.6 and improved.  She did not tolerate metformin  in the past.  Continue Wegovy  for pharmacoprophylaxis.

## 2024-06-02 NOTE — Assessment & Plan Note (Signed)
 On CPAP with reported good compliance. Continue PAP therapy. Losing 15% or more of body weight may improve AHI.  She has lost 15.6% of total body weight medically supervised weight management plan.  She is now on Wegovy  medication will be increased to 1.7 mg once a week

## 2024-06-02 NOTE — Assessment & Plan Note (Signed)
 Weight: decrease of 60 lb (16.4%) over 1 year, 5 months  Start: 12/10/2022 366 lb (166 kg) (H)  End: 06/01/2024 306 lb (138.8 kg) (H)  Obesity with prediabetes and insulin  resistance Ongoing weight loss journey with a total loss of 60 pounds over approximately 17 months. Current weight loss has slowed, possibly due to reaching caloric equilibrium, slowed metabolism, or reduced medication efficacy. Current caloric intake is 1500 calories, with a metabolic rate of 7899 calories. Exercise is currently 2 days a week, 15-20 minutes per session. Emphasis on increasing physical activity to 240 minutes per week to aid in weight loss and maintenance. Discussion on the importance of muscle activation and its benefits on metabolism and overall health. - Continue 1500 calorie nutrition plan. - Increase physical activity to 240 minutes per week. - Encourage participation in group exercises or aquatic activities. - Monitor weight and adjust caloric intake as needed.

## 2024-06-07 ENCOUNTER — Ambulatory Visit (INDEPENDENT_AMBULATORY_CARE_PROVIDER_SITE_OTHER): Admitting: Family Medicine

## 2024-06-07 ENCOUNTER — Encounter (HOSPITAL_BASED_OUTPATIENT_CLINIC_OR_DEPARTMENT_OTHER): Payer: Self-pay | Admitting: Family Medicine

## 2024-06-07 VITALS — BP 121/76 | HR 84 | Ht 68.0 in | Wt 307.0 lb

## 2024-06-07 DIAGNOSIS — Z0000 Encounter for general adult medical examination without abnormal findings: Secondary | ICD-10-CM | POA: Insufficient documentation

## 2024-06-07 DIAGNOSIS — Z1211 Encounter for screening for malignant neoplasm of colon: Secondary | ICD-10-CM

## 2024-06-07 DIAGNOSIS — Z1231 Encounter for screening mammogram for malignant neoplasm of breast: Secondary | ICD-10-CM | POA: Diagnosis not present

## 2024-06-07 NOTE — Progress Notes (Signed)
 Subjective:    CC: Annual Physical Exam  HPI:  Lindsey Bowman is a 48 y.o. presenting for annual physical  I reviewed the past medical history, family history, social history, surgical history, and allergies today and no changes were needed.  Please see the problem list section below in epic for further details.  Past Medical History: Past Medical History:  Diagnosis Date   Anemia    Anxiety    Arthritis    Back pain    Carpal tunnel syndrome    Depression    Edema of both lower extremities    HSV-2 infection    No pertinent past medical history    Obese    Osteoarthritis    Prediabetes    Sleep apnea    Uterine polyp    Vitamin D  deficiency    Past Surgical History: Past Surgical History:  Procedure Laterality Date   DILATION AND CURETTAGE OF UTERUS     HYSTEROSCOPY     SHOULDER SURGERY Bilateral    Social History: Social History   Socioeconomic History   Marital status: Divorced    Spouse name: Not on file   Number of children: Not on file   Years of education: Not on file   Highest education level: Bachelor's degree (e.g., BA, AB, BS)  Occupational History   Occupation: Diplomatic Services operational officer work  Tobacco Use   Smoking status: Former    Current packs/day: 0.00    Types: Cigarettes    Quit date: 02/26/1998    Years since quitting: 26.2   Smokeless tobacco: Never  Vaping Use   Vaping status: Never Used  Substance and Sexual Activity   Alcohol use: Yes    Alcohol/week: 2.0 standard drinks of alcohol    Types: 2 Cans of beer per week    Comment: no while pregnant   Drug use: No   Sexual activity: Not Currently    Birth control/protection: Abstinence  Other Topics Concern   Not on file  Social History Narrative   Not on file   Social Drivers of Health   Financial Resource Strain: High Risk (01/25/2024)   Overall Financial Resource Strain (CARDIA)    Difficulty of Paying Living Expenses: Hard  Food Insecurity: Food Insecurity Present (01/25/2024)   Hunger  Vital Sign    Worried About Running Out of Food in the Last Year: Sometimes true    Ran Out of Food in the Last Year: Sometimes true  Transportation Needs: No Transportation Needs (01/25/2024)   PRAPARE - Administrator, Civil Service (Medical): No    Lack of Transportation (Non-Medical): No  Physical Activity: Insufficiently Active (01/25/2024)   Exercise Vital Sign    Days of Exercise per Week: 1 day    Minutes of Exercise per Session: 20 min  Stress: Stress Concern Present (01/25/2024)   [employer] of Occupational Health - Occupational Stress Questionnaire    Feeling of Stress : Very much  Social Connections: Moderately Integrated (01/25/2024)   Social Connection and Isolation Panel    Frequency of Communication with Friends and Family: Three times a week    Frequency of Social Gatherings with Friends and Family: Three times a week    Attends Religious Services: More than 4 times per year    Active Member of Clubs or Organizations: Yes    Attends Engineer, structural: More than 4 times per year    Marital Status: Divorced   Family History: Family History  Problem Relation Age of  Onset   Diabetes Mother    High blood pressure Mother    Thyroid  disease Mother    Depression Mother    Anxiety disorder Mother    Liver disease Mother    Sleep apnea Mother    Obesity Mother    Diabetes Father    High blood pressure Father    Sleep apnea Father    Obesity Father    Miscarriages / Stillbirths Maternal Aunt    Allergies: No Known Allergies Medications: See med rec.  Review of Systems: No headache, visual changes, nausea, vomiting, diarrhea, constipation, dizziness, abdominal pain, skin rash, fevers, chills, night sweats, swollen lymph nodes, weight loss, chest pain, body aches, joint swelling, muscle aches, shortness of breath, mood changes, visual or auditory hallucinations.  Objective:    BP 121/76 (BP Location: Right Arm, Patient Position: Sitting,  Cuff Size: Large)   Pulse 84   Ht 5' 8 (1.727 m)   Wt (!) 307 lb (139.3 kg)   LMP 05/25/2024   SpO2 97%   BMI 46.68 kg/m   General: Well Developed, well nourished, and in no acute distress.  Neuro: Alert and oriented x3, extra-ocular muscles intact, sensation grossly intact. Cranial nerves II through XII are intact, motor, sensory, and coordinative functions are all intact. HEENT: Normocephalic, atraumatic, pupils equal round reactive to light, neck supple, no masses, no lymphadenopathy, thyroid  nonpalpable. Oropharynx, nasopharynx, external ear canals are unremarkable. Skin: Warm and dry, no rashes noted. Cardiac: Regular rate and rhythm, no murmurs rubs or gallops. Respiratory: Clear to auscultation bilaterally. Not using accessory muscles, speaking in full sentences. Abdominal: Soft, nontender, nondistended, positive bowel sounds, no masses, no organomegaly. Musculoskeletal: Shoulder, elbow, wrist, hip, knee, ankle stable, and with full range of motion.  Impression and Recommendations:    Wellness examination Assessment & Plan: Routine HCM labs reviewed. HCM reviewed/discussed. Anticipatory guidance regarding healthy weight, lifestyle and choices given. Recommend healthy diet.  Recommend approximately 150 minutes/week of moderate intensity exercise Recommend regular dental and vision exams Always use seatbelt/lap and shoulder restraints Recommend using smoke alarms and checking batteries at least twice a year Recommend using sunscreen when outside Discussed colon cancer screening recommendations, options.  Patient elects for Cologuard Discussed immunization recommendations  Orders: -     3D Screening Mammogram, Left and Right; Future  Special screening for malignant neoplasms, colon -     Cologuard  Encounter for screening mammogram for malignant neoplasm of breast -     3D Screening Mammogram, Left and Right; Future  Return in about 1 year (around 06/07/2025) for  CPE.   ___________________________________________ Adanely Reynoso de [place], MD, ABFM, CAQSM Primary Care and Sports Medicine [facility]

## 2024-06-07 NOTE — Assessment & Plan Note (Signed)
 Routine HCM labs reviewed. HCM reviewed/discussed. Anticipatory guidance regarding healthy weight, lifestyle and choices given. Recommend healthy diet.  Recommend approximately 150 minutes/week of moderate intensity exercise Recommend regular dental and vision exams Always use seatbelt/lap and shoulder restraints Recommend using smoke alarms and checking batteries at least twice a year Recommend using sunscreen when outside Discussed colon cancer screening recommendations, options.  Patient elects for Cologuard Discussed immunization recommendations

## 2024-06-07 NOTE — Patient Instructions (Signed)
   Medication Instructions:  Your physician recommends that you continue on your current medications as directed. Please refer to the Current Medication list given to you today. --If you need a refill on any your medications before your next appointment, please call your pharmacy first. If no refills are authorized on file call the office.--   Follow-Up: Your next appointment:   Your physician recommends that you schedule a follow-up appointment in: 1 year physical with Dr. de [place]  You will receive a text message or e-mail with a link to a survey about your care and experience with [place] today! We would greatly appreciate your feedback!   Thanks for letting us be apart of your health journey!!  Primary Care and Sports Medicine   Dr. Ceasar Mons [place]   We encourage you to activate your patient portal called "MyChart".  Sign up information is provided on this After Visit Summary.  MyChart is used to connect with patients for Virtual Visits (Telemedicine).  Patients are able to view lab/test results, encounter notes, upcoming appointments, etc.  Non-urgent messages can be sent to your provider as well. To learn more about what you can do with MyChart, please visit --  [email or web address].

## 2024-06-09 ENCOUNTER — Encounter (INDEPENDENT_AMBULATORY_CARE_PROVIDER_SITE_OTHER): Payer: Self-pay | Admitting: Internal Medicine

## 2024-06-12 ENCOUNTER — Ambulatory Visit (INDEPENDENT_AMBULATORY_CARE_PROVIDER_SITE_OTHER): Admitting: Internal Medicine

## 2024-06-12 ENCOUNTER — Encounter (INDEPENDENT_AMBULATORY_CARE_PROVIDER_SITE_OTHER): Payer: Self-pay | Admitting: Internal Medicine

## 2024-06-12 VITALS — BP 114/83 | HR 83 | Temp 98.8°F | Ht 68.0 in | Wt 307.0 lb

## 2024-06-12 DIAGNOSIS — R7303 Prediabetes: Secondary | ICD-10-CM

## 2024-06-12 DIAGNOSIS — R638 Other symptoms and signs concerning food and fluid intake: Secondary | ICD-10-CM

## 2024-06-12 DIAGNOSIS — G4733 Obstructive sleep apnea (adult) (pediatric): Secondary | ICD-10-CM

## 2024-06-12 DIAGNOSIS — Z6841 Body Mass Index (BMI) 40.0 and over, adult: Secondary | ICD-10-CM | POA: Diagnosis not present

## 2024-06-12 DIAGNOSIS — E66813 Obesity, class 3: Secondary | ICD-10-CM | POA: Diagnosis not present

## 2024-06-12 MED ORDER — PHENTERMINE HCL 37.5 MG PO TABS: 18.7500 mg | ORAL_TABLET | Freq: Every day | ORAL | 0 refills | Status: DC

## 2024-06-12 MED ORDER — TOPIRAMATE 25 MG PO TABS: ORAL_TABLET | ORAL | 0 refills | Status: DC

## 2024-06-12 NOTE — Assessment & Plan Note (Signed)
 Sleep study reviewed it was mild.  This does not qualify her for Zepbound

## 2024-06-12 NOTE — Progress Notes (Signed)
 Office: [phone]  /  Fax: [phone]  Weight Summary and Body Composition Analysis (BIA)  Vitals Temp: 98.8 F (37.1 C) BP: 114/83 Pulse Rate: 83 SpO2: 98 %   Anthropometric Measurements Height: 5' 8 (1.727 m) Weight: (!) 307 lb (139.3 kg) BMI (Calculated): 46.69 Weight at Last Visit: 306 lb Weight Lost Since Last Visit: 0 lb Weight Gained Since Last Visit: 1 lb Starting Weight: 360 lb Total Weight Loss (lbs): 53 lb (24 kg) Peak Weight: 361 lb   Body Composition  Body Fat %: 52.3 % Fat Mass (lbs): 160.6 lbs Muscle Mass (lbs): 139.2 lbs Total Body Water (lbs): 104.2 lbs Visceral Fat Rating : 18    RMR: 2102  Today's Visit #: 19  Starting Date: 11/04/22   Subjective   Chief Complaint: Obesity  Interval History Discussed the use of AI scribe software for clinical note transcription with the patient, who gave verbal consent to proceed.  History of Present Illness Lindsey Bowman is a 48 year old female with obesity and sleep apnea who presents for weight management and medication review.  Patient has lost coverage of Wegovy  and is inquiring about other antiobesity medications.  She has been on Qsymia  in the past with good success but brand-name medication is cost prohibitive.  She is following a 1500-calorie nutrition plan with good adherence.  Since last office visit she has gained 1 pound she has lost a total of about 57 pounds she is not tracking reports eating more whole foods getting the recommended amount of protein reports adequate sleep she is exercising 3 days a week 15 to 30 minutes mostly cardio  She has a history of sleep apnea and uses a CPAP machine. A home sleep study conducted in 2023 indicated mild sleep apnea with eighteen apnea events and an apnea index of three per hour.  She is currently seeking management for her weight and has previously been on Qsymia , a combination of phentermine  and topiramate . She recalls feeling significantly  better and experiencing weight loss while on Qsymia . She was on a dose of 11.25 mg of Qsymia  and prefers this medication over injectable alternatives due to discomfort with injections and better tolerance of Qsymia .  She is on Medicaid and is concerned about insurance coverage for her medications.       Challenges affecting patient progress: strong hunger signals and/or impaired satiety / inhibitory control, lack of strengthening exercise, menopause, and having difficulties with GLP-1 or AOM coverage.    Pharmacotherapy for weight management: She is currently taking Wegovy  medication no longer covered by her insurance previously was on Qsymia .   Assessment and Plan   Treatment Plan For Obesity:  Recommended Dietary Goals  [name] is currently in the action stage of change. As such, her goal is to continue weight management plan. She has agreed to: continue current plan  Behavioral Health and Counseling  We discussed the following behavioral modification strategies today: continue to work on maintaining a reduced calorie state, getting the recommended amount of protein, incorporating whole foods, making healthy choices, staying well hydrated and practicing mindfulness when eating. and increase protein intake, fibrous foods (25 grams per day for women, 30 grams for men) and water to improve satiety and decrease hunger signals. .  Additional education and resources provided today: None  Recommended Physical Activity Goals  [name] has been advised to work up to 150 minutes of moderate intensity aerobic activity a week and strengthening exercises 2-3 times per week for cardiovascular health, weight  loss maintenance and preservation of muscle mass.  She has agreed to :  Think about enjoyable ways to increase daily physical activity and overcoming barriers to exercise, Increase physical activity in their day and reduce sedentary time (increase NEAT)., Increase volume of physical activity to a  goal of 240 minutes a week, and Combine aerobic and strengthening exercises for efficiency and improved cardiometabolic health.  Medical Interventions and Pharmacotherapy  We discussed various medication options to help [name] with her weight loss efforts and we both agreed to : Start anti-obesity medication.  In addition to reduced calorie nutrition plan (RCNP), behavioral strategies and physical activity, [name] would benefit from pharmacotherapy to assist with hunger signals, satiety and cravings. This will reduce obesity-related health risks by inducing weight loss, and help reduce food consumption and adherence to [facility]) . It may also improve QOL by improving self-confidence and reduce the  setbacks associated with metabolic adaptations.  After discussion of treatment options, mechanisms of action, benefits, side effects, contraindications and shared decision making she is agreeable to restarting topiramate  and phentermine , off-label due to medication cost and lack of access to GLP-1 coverage. Patient also made aware that medication is indicated for long-term management of obesity and the risk of weight regain following discontinuation of treatment and hence the importance of adhering to medical weight loss plan.    Medication safety: Reviewed common side effects of phentermine  Reviewed for contraindications, none present Reviewed controlled substance agreement and signed today Reviewed state registry for controlled substances no other controlled substances found Medication will be discontinued if less than 5% weight loss in 6 months Discussed safety data and off label use for long-term treatment of obesity.   Initiated topiramate  for off-label use in weight management. Explained medication is FDA-approved for seizures/migraines but not weight loss alone. Reviewed evidence of efficacy in appetite suppression and use in combination weight loss therapy (e.g., Qsymia ).  Risks reviewed: cognitive  slowing, fatigue, paresthesia, mood effects, teratogenicity (need for reliable means of birth control in women of childbearing age). Alternatives discussed. Patient informed of off-label nature and consents to treatment. Will titrate dose gradually and monitor monthly.   Associated Conditions Impacted by Obesity Treatment  Assessment & Plan Class 3 severe obesity with serious comorbidity and body mass index (BMI) of 45.0 to 49.9 in adult, unspecified obesity type (HCC) See obesity treatment plan Prediabetes Consider restarting metformin  as she is no longer on Wegovy  OSA (obstructive sleep apnea) Sleep study reviewed it was mild.  This does not qualify her for Zepbound Abnormal food appetite She has increased orexigenic signaling, impaired satiety and inhibitory control. This is secondary to an abnormal energy regulation system and pathological neurohormonal pathways characteristic of excess adiposity.  In addition to nutritional and behavioral strategies she benefits from ongoing pharmacotherapy.  She had been on Wegovy  but no longer has coverage she will be restarted again on Qsymia  using generics due to cost.  See above discussion           Objective   Physical Exam:  Blood pressure 114/83, pulse 83, temperature 98.8 F (37.1 C), height 5' 8 (1.727 m), weight (!) 307 lb (139.3 kg), last menstrual period 05/25/2024, SpO2 98%. Body mass index is 46.68 kg/m.  General: She is overweight, cooperative, alert, well developed, and in no acute distress. PSYCH: Has normal mood, affect and thought process.   HEENT: EOMI, sclerae are anicteric. Lungs: Normal breathing effort, no conversational dyspnea. Extremities: No edema.  Neurologic: No gross sensory or motor deficits. No  tremors or fasciculations noted.    Diagnostic Data Reviewed:  BMET    Component Value Date/Time   NA 139 03/02/2024 1052   K 4.4 03/02/2024 1052   CL 102 03/02/2024 1052   CO2 22 03/02/2024 1052   GLUCOSE  92 03/02/2024 1052   GLUCOSE 91 05/11/2009 0257   BUN 6 03/02/2024 1052   CREATININE 0.81 03/02/2024 1052   CALCIUM 9.4 03/02/2024 1052   GFRNONAA >60 05/11/2009 0257   GFRAA  05/11/2009 0257    >60        The eGFR has been calculated using the MDRD equation. This calculation has not been validated in all clinical situations. eGFR's persistently <60 mL/min signify possible Chronic Kidney Disease.   Lab Results  Component Value Date   HGBA1C 5.6 03/02/2024   HGBA1C 6.4 (H) 11/04/2022   Lab Results  Component Value Date   INSULIN  35.8 (H) 10/12/2023   INSULIN  57.0 (H) 11/04/2022   Lab Results  Component Value Date   TSH 1.740 03/02/2024   CBC    Component Value Date/Time   WBC 6.0 03/02/2024 1052   WBC 9.2 05/12/2009 0620   RBC 4.49 03/02/2024 1052   RBC 3.35 (L) 05/12/2009 0620   HGB 13.2 03/02/2024 1052   HCT 42.4 03/02/2024 1052   PLT 423 03/02/2024 1052   MCV 94 03/02/2024 1052   MCH 29.4 03/02/2024 1052   MCHC 31.1 (L) 03/02/2024 1052   MCHC 34.3 05/12/2009 0620   RDW 12.2 03/02/2024 1052   Iron Studies No results found for: IRON, TIBC, FERRITIN, IRONPCTSAT Lipid Panel     Component Value Date/Time   CHOL 152 03/02/2024 1052   TRIG 63 03/02/2024 1052   HDL 58 03/02/2024 1052   CHOLHDL 2.6 03/02/2024 1052   CHOLHDL 2.5 Ratio 09/02/2007 2043   VLDL 20 09/02/2007 2043   LDLCALC 81 03/02/2024 1052   Hepatic Function Panel     Component Value Date/Time   PROT 7.1 03/02/2024 1052   ALBUMIN 4.1 03/02/2024 1052   AST 20 03/02/2024 1052   ALT 18 03/02/2024 1052   ALKPHOS 51 03/02/2024 1052   BILITOT 0.3 03/02/2024 1052   BILIDIR <0.1 mg/dL 87/73/7991 7956   IBILI NOT CALC mg/dL 87/73/7991 7956      Component Value Date/Time   TSH 1.740 03/02/2024 1052   TSH 1.760 11/04/2022 0829   Nutritional Lab Results  Component Value Date   VD25OH 27.3 (L) 03/02/2024   VD25OH 30.5 03/04/2023   VD25OH 22.5 (L) 11/04/2022     Medications: Outpatient Encounter Medications as of 06/12/2024  Medication Sig   phentermine  (ADIPEX-P ) 37.5 MG tablet Take 0.5 tablets (18.75 mg total) by mouth daily before breakfast.   semaglutide -weight management (WEGOVY ) 1.7 MG/0.75ML SOAJ SQ injection Inject 1.7 mg into the skin once a week.   topiramate  (TOPAMAX ) 25 MG tablet Take one to two tablets in the evening for weight management   ondansetron  (ZOFRAN -ODT) 4 MG disintegrating tablet Take 1 tablet (4 mg total) by mouth every 8 (eight) hours as needed. (Patient not taking: Reported on 06/12/2024)   No facility-administered encounter medications on file as of 06/12/2024.     Follow-Up   Return in about 4 weeks (around 07/10/2024) for For Weight Mangement with Dr. Francyne.SABRA She was informed of the importance of frequent follow up visits to maximize her success with intensive lifestyle modifications for her multiple health conditions.  Attestation Statement   Reviewed by clinician on day of visit: allergies, medications, problem  list, medical history, surgical history, family history, social history, and previous encounter notes.     Lucas Parker, MD

## 2024-06-12 NOTE — Assessment & Plan Note (Signed)
 Consider restarting metformin  as she is no longer on Wegovy 

## 2024-06-12 NOTE — Assessment & Plan Note (Signed)
 See obesity treatment plan

## 2024-06-12 NOTE — Assessment & Plan Note (Signed)
 She has increased orexigenic signaling, impaired satiety and inhibitory control. This is secondary to an abnormal energy regulation system and pathological neurohormonal pathways characteristic of excess adiposity.  In addition to nutritional and behavioral strategies she benefits from ongoing pharmacotherapy.  She had been on Wegovy  but no longer has coverage she will be restarted again on Qsymia  using generics due to cost.  See above discussion

## 2024-06-17 LAB — COLOGUARD: COLOGUARD: NEGATIVE

## 2024-06-20 ENCOUNTER — Ambulatory Visit (HOSPITAL_BASED_OUTPATIENT_CLINIC_OR_DEPARTMENT_OTHER): Payer: Self-pay | Admitting: Family Medicine

## 2024-06-28 ENCOUNTER — Ambulatory Visit (INDEPENDENT_AMBULATORY_CARE_PROVIDER_SITE_OTHER): Admitting: Internal Medicine

## 2024-07-10 ENCOUNTER — Encounter (INDEPENDENT_AMBULATORY_CARE_PROVIDER_SITE_OTHER): Payer: Self-pay | Admitting: Internal Medicine

## 2024-07-10 ENCOUNTER — Ambulatory Visit (INDEPENDENT_AMBULATORY_CARE_PROVIDER_SITE_OTHER): Payer: Self-pay | Admitting: Internal Medicine

## 2024-07-10 VITALS — BP 130/82 | HR 91 | Temp 97.7°F | Ht 68.0 in | Wt 303.0 lb

## 2024-07-10 DIAGNOSIS — G4733 Obstructive sleep apnea (adult) (pediatric): Secondary | ICD-10-CM

## 2024-07-10 DIAGNOSIS — Z6841 Body Mass Index (BMI) 40.0 and over, adult: Secondary | ICD-10-CM

## 2024-07-10 DIAGNOSIS — E66813 Obesity, class 3: Secondary | ICD-10-CM | POA: Diagnosis not present

## 2024-07-10 DIAGNOSIS — R7303 Prediabetes: Secondary | ICD-10-CM

## 2024-07-10 MED ORDER — PHENTERMINE HCL 37.5 MG PO TABS: 18.7500 mg | ORAL_TABLET | Freq: Every day | ORAL | 0 refills | Status: DC

## 2024-07-10 MED ORDER — TOPIRAMATE 25 MG PO TABS: ORAL_TABLET | ORAL | 0 refills | Status: DC

## 2024-07-10 NOTE — Progress Notes (Signed)
 Office: [phone]  /  Fax: [phone]  Weight Summary and Body Composition Analysis (BIA)  Vitals Temp: 97.7 F (36.5 C) BP: 130/82 Pulse Rate: 91 SpO2: 95 %   Anthropometric Measurements Height: 5' 8 (1.727 m) Weight: (!) 303 lb (137.4 kg) BMI (Calculated): 46.08 Weight at Last Visit: 306 lb Weight Lost Since Last Visit: 3 lb Weight Gained Since Last Visit: 0 lb Starting Weight: 360 lb Total Weight Loss (lbs): 56 lb (25.4 kg) Peak Weight: 361 lb   Body Composition  Body Fat %: 51.3 % Fat Mass (lbs): 155.8 lbs Muscle Mass (lbs): 140.6 lbs Total Body Water (lbs): 100.8 lbs Visceral Fat Rating : 17    RMR: 2102  Today's Visit #: 20  Starting Date: 11/04/22   Subjective   Chief Complaint: Obesity  Interval History Discussed the use of AI scribe software for clinical note transcription with the patient, who gave verbal consent to proceed.  History of Present Illness Lindsey Bowman is a 48 year old female who presents for a follow-up on her weight management regimen.  She is following a 1500-calorie nutrition plan 45% of the time.  She has not been tracking reports eating more whole foods getting the recommended amount of protein maintaining adequate hydration she has not been exercising.  Reports adequate sleep  She has had some difficulties with antiobesity medications previously was on Qsymia  with good results was later switched to Wegovy , lost coverage she is now on topiramate  and phentermine  individual agents, off label, long-term use.  She is taking the phentermine  and topiramate  in the morning and denies any adverse effect she has only been taking the 1 of topiramate .  She reports improved satiety and satiation.  She is currently on a new weight management regimen, taking half a tablet of phentermine  and one tablet of topiramate  in the morning. Initially, she experienced sleepiness with topiramate , but adjusting the timing to daytime resolved this  issue.  She is actively working on increasing her physical activity. She is on her feet all day at work and is considering tracking her steps to ensure she reaches 5,000 to 10,000 steps daily. She has lost five pounds and reduced her body fat. She reflects on her weight loss journey, having lost 63 pounds from her highest weight of 366 pounds.  She reports cold symptoms for the past two days, with the onset of symptoms yesterday. No palpitations and she has not consumed coffee or energy drinks recently. She is cautious with decongestants like Sudafed, as they may raise her blood pressure, and is currently using Theraflu for cold symptoms.     Challenges affecting patient progress: low volume of physical activity at present  and having difficulties with GLP-1 or AOM coverage.    Pharmacotherapy for weight management: She is currently taking Topiramate  (off label use, single agent) with adequate clinical response  and without side effects. and Phentermine  (longterm use, off-label, single agent)  with adequate clinical response  and without side effects..   Assessment and Plan   Treatment Plan For Obesity:  Recommended Dietary Goals  [name] is currently in the action stage of change. As such, her goal is to continue weight management plan. She has agreed to: continue current plan  Behavioral Health and Counseling  We discussed the following behavioral modification strategies today: continue to work on maintaining a reduced calorie state, getting the recommended amount of protein, incorporating whole foods, making healthy choices, staying well hydrated and practicing mindfulness when eating. and increase  protein intake, fibrous foods (25 grams per day for women, 30 grams for men) and water to improve satiety and decrease hunger signals. .  Additional education and resources provided today: None  Recommended Physical Activity Goals  [name] has been advised to work up to 150 minutes of  moderate intensity aerobic activity a week and strengthening exercises 2-3 times per week for cardiovascular health, weight loss maintenance and preservation of muscle mass.  She has agreed to :  Think about enjoyable ways to increase daily physical activity and overcoming barriers to exercise and Increase physical activity in their day and reduce sedentary time (increase NEAT).  Medical Interventions and Pharmacotherapy  We discussed various medication options to help [name] with her weight loss efforts and we both agreed to : Adequate clinical response to anti-obesity medication, continue current regimen and Patient was counseled on the importance of maintaining healthy lifestyle habits, including balanced nutrition, regular physical activity, and behavioral modifications, while taking antiobesity medication.  Patient verbalized understanding that medication is an adjunct to, not a replacement for, lifestyle changes and that the long-term success and weight maintenance depend on continued adherence to these strategies..  Continue phentermine  and topiramate .  Discussed long-term use of phentermine  for weight management. Patient informed that use beyond 12 weeks is off-label. Reviewed evidence supporting extended use in select patients with regular monitoring. Risks discussed: insomnia, increased heart rate, elevated BP, and potential for dependence. Alternatives include FDA-approved medications (e.g., Wegovy , Qsymia ), lifestyle interventions, and bariatric surgery. Medical history and controlled substance registry reviewed for contraindications.   Patient verbalized understanding and agreed to continue phentermine  with close monitoring. Plan includes regular follow-up every 4-6 weeks to assess efficacy, side effects, and vitals.   Associated Conditions Impacted by Obesity Treatment  Assessment & Plan Class 3 severe obesity with serious comorbidity and body mass index (BMI) of 45.0 to 49.9 in adult,  unspecified obesity type (HCC) Weight: decrease of 63 lb (17.2%) over 1 year, 7 months  Start: 12/10/2022 366 lb (166 kg) (H)  End: 07/10/2024 303 lb (137.4 kg) (H)  Management with phentermine  and topiramate . Current regimen includes half of phentermine  and one tablet of topiramate  during the day. Discussed potential side effects of topiramate , including increased sedation and interaction with alcohol. Emphasized the importance of maintaining physical activity and nutrition to sustain weight loss. Discussed the need for a tapering schedule for topiramate  to avoid withdrawal seizures. Highlighted the importance of not relying solely on medication for weight management and the need for lifestyle changes to maintain weight loss. - Increase topiramate  to two tablets during the day if no side effects are experienced. - Continue phentermine  at half dose. - Track physical activity and aim for 5,000 to 10,000 steps per day. - Engage in 240 minutes of exercise per week. - Focus on nutrition, aiming for 60-70% of weight management through diet. - Taper off topiramate  if discontinuing: 25 mg every other day for 7 days, then stop. Prediabetes Her hemoglobin A1c and insulin  levels had improved significantly but she is no longer on Wegovy .  She is now losing weight on phentermine  and topiramate .  Her physical activity levels have gone down.  We discussed again the role and benefits of regular physical activity and strategies to overcome barriers. OSA (obstructive sleep apnea) Sleep study reviewed it was mild.  She has lost 17% of total body weight with current weight management strategy.          Objective   Physical Exam:  Blood pressure 130/82,  pulse 91, temperature 97.7 F (36.5 C), height 5' 8 (1.727 m), weight (!) 303 lb (137.4 kg), last menstrual period 06/21/2024, SpO2 95%. Body mass index is 46.07 kg/m.  General: She is overweight, cooperative, alert, well developed, and in no acute  distress. PSYCH: Has normal mood, affect and thought process.   HEENT: EOMI, sclerae are anicteric. Lungs: Normal breathing effort, no conversational dyspnea. Extremities: No edema.  Neurologic: No gross sensory or motor deficits. No tremors or fasciculations noted.    Diagnostic Data Reviewed:  BMET    Component Value Date/Time   NA 139 03/02/2024 1052   K 4.4 03/02/2024 1052   CL 102 03/02/2024 1052   CO2 22 03/02/2024 1052   GLUCOSE 92 03/02/2024 1052   GLUCOSE 91 05/11/2009 0257   BUN 6 03/02/2024 1052   CREATININE 0.81 03/02/2024 1052   CALCIUM 9.4 03/02/2024 1052   GFRNONAA >60 05/11/2009 0257   GFRAA  05/11/2009 0257    >60        The eGFR has been calculated using the MDRD equation. This calculation has not been validated in all clinical situations. eGFR's persistently <60 mL/min signify possible Chronic Kidney Disease.   Lab Results  Component Value Date   HGBA1C 5.6 03/02/2024   HGBA1C 6.4 (H) 11/04/2022   Lab Results  Component Value Date   INSULIN  35.8 (H) 10/12/2023   INSULIN  57.0 (H) 11/04/2022   Lab Results  Component Value Date   TSH 1.740 03/02/2024   CBC    Component Value Date/Time   WBC 6.0 03/02/2024 1052   WBC 9.2 05/12/2009 0620   RBC 4.49 03/02/2024 1052   RBC 3.35 (L) 05/12/2009 0620   HGB 13.2 03/02/2024 1052   HCT 42.4 03/02/2024 1052   PLT 423 03/02/2024 1052   MCV 94 03/02/2024 1052   MCH 29.4 03/02/2024 1052   MCHC 31.1 (L) 03/02/2024 1052   MCHC 34.3 05/12/2009 0620   RDW 12.2 03/02/2024 1052   Iron Studies No results found for: IRON, TIBC, FERRITIN, IRONPCTSAT Lipid Panel     Component Value Date/Time   CHOL 152 03/02/2024 1052   TRIG 63 03/02/2024 1052   HDL 58 03/02/2024 1052   CHOLHDL 2.6 03/02/2024 1052   CHOLHDL 2.5 Ratio 09/02/2007 2043   VLDL 20 09/02/2007 2043   LDLCALC 81 03/02/2024 1052   Hepatic Function Panel     Component Value Date/Time   PROT 7.1 03/02/2024 1052   ALBUMIN 4.1  03/02/2024 1052   AST 20 03/02/2024 1052   ALT 18 03/02/2024 1052   ALKPHOS 51 03/02/2024 1052   BILITOT 0.3 03/02/2024 1052   BILIDIR <0.1 mg/dL 87/73/7991 7956   IBILI NOT CALC mg/dL 87/73/7991 7956      Component Value Date/Time   TSH 1.740 03/02/2024 1052   TSH 1.760 11/04/2022 0829   Nutritional Lab Results  Component Value Date   VD25OH 27.3 (L) 03/02/2024   VD25OH 30.5 03/04/2023   VD25OH 22.5 (L) 11/04/2022    Medications: Outpatient Encounter Medications as of 07/10/2024  Medication Sig   ondansetron  (ZOFRAN -ODT) 4 MG disintegrating tablet Take 1 tablet (4 mg total) by mouth every 8 (eight) hours as needed. (Patient not taking: Reported on 06/12/2024)   phentermine  (ADIPEX-P ) 37.5 MG tablet Take 0.5 tablets (18.75 mg total) by mouth daily before breakfast.   topiramate  (TOPAMAX ) 25 MG tablet Take one to two tablets in the evening for weight management   [DISCONTINUED] phentermine  (ADIPEX-P ) 37.5 MG tablet Take 0.5 tablets (18.75  mg total) by mouth daily before breakfast.   [DISCONTINUED] topiramate  (TOPAMAX ) 25 MG tablet Take one to two tablets in the evening for weight management   No facility-administered encounter medications on file as of 07/10/2024.     Follow-Up   Return in about 4 weeks (around 08/07/2024) for For Weight Mangement with Dr. Francyne.SABRA She was informed of the importance of frequent follow up visits to maximize her success with intensive lifestyle modifications for her multiple health conditions.  Attestation Statement   Reviewed by clinician on day of visit: allergies, medications, problem list, medical history, surgical history, family history, social history, and previous encounter notes.     Lucas Francyne, MD

## 2024-07-10 NOTE — Assessment & Plan Note (Signed)
 Sleep study reviewed it was mild.  She has lost 17% of total body weight with current weight management strategy.

## 2024-07-10 NOTE — Assessment & Plan Note (Signed)
 Weight: decrease of 63 lb (17.2%) over 1 year, 7 months  Start: 12/10/2022 366 lb (166 kg) (H)  End: 07/10/2024 303 lb (137.4 kg) (H)  Management with phentermine  and topiramate . Current regimen includes half of phentermine  and one tablet of topiramate  during the day. Discussed potential side effects of topiramate , including increased sedation and interaction with alcohol. Emphasized the importance of maintaining physical activity and nutrition to sustain weight loss. Discussed the need for a tapering schedule for topiramate  to avoid withdrawal seizures. Highlighted the importance of not relying solely on medication for weight management and the need for lifestyle changes to maintain weight loss. - Increase topiramate  to two tablets during the day if no side effects are experienced. - Continue phentermine  at half dose. - Track physical activity and aim for 5,000 to 10,000 steps per day. - Engage in 240 minutes of exercise per week. - Focus on nutrition, aiming for 60-70% of weight management through diet. - Taper off topiramate  if discontinuing: 25 mg every other day for 7 days, then stop.

## 2024-07-10 NOTE — Assessment & Plan Note (Signed)
 Her hemoglobin A1c and insulin  levels had improved significantly but she is no longer on Wegovy .  She is now losing weight on phentermine  and topiramate .  Her physical activity levels have gone down.  We discussed again the role and benefits of regular physical activity and strategies to overcome barriers.

## 2024-07-18 ENCOUNTER — Encounter (INDEPENDENT_AMBULATORY_CARE_PROVIDER_SITE_OTHER): Payer: Self-pay | Admitting: Internal Medicine

## 2024-07-21 ENCOUNTER — Ambulatory Visit (INDEPENDENT_AMBULATORY_CARE_PROVIDER_SITE_OTHER): Admitting: Family Medicine

## 2024-07-21 ENCOUNTER — Ambulatory Visit: Payer: Self-pay | Admitting: *Deleted

## 2024-07-21 ENCOUNTER — Encounter: Payer: Self-pay | Admitting: Family Medicine

## 2024-07-21 VITALS — BP 118/80 | HR 90 | Temp 98.1°F | Ht 68.0 in | Wt 310.4 lb

## 2024-07-21 DIAGNOSIS — J0120 Acute ethmoidal sinusitis, unspecified: Secondary | ICD-10-CM

## 2024-07-21 DIAGNOSIS — H66001 Acute suppurative otitis media without spontaneous rupture of ear drum, right ear: Secondary | ICD-10-CM

## 2024-07-21 MED ORDER — AMOXICILLIN-POT CLAVULANATE 875-125 MG PO TABS: 1.0000 | ORAL_TABLET | Freq: Two times a day (BID) | ORAL | 0 refills | Status: AC

## 2024-07-21 NOTE — Telephone Encounter (Signed)
 FYI Only or Action Required?: FYI only for provider: appointment scheduled on 11/14.  Patient was last seen in primary care on 07/10/2024 by Francyne Romano, MD.  Called Nurse Triage reporting Cough.  Symptoms began several weeks ago.  Interventions attempted: Prescription medications: Amoxicillin .  Symptoms are: unchanged.  Triage Disposition: No disposition on file.  Patient/caregiver understands and will follow disposition?: yes  Copied from CRM [id number]. Topic: Clinical - Red Word Triage >> Jul 21, 2024  8:28 AM Everette C wrote: [facility] that prompted transfer to Nurse Triage: The patient has a significant cough and shortness of breath Reason for Disposition  [1] Continuous (nonstop) coughing interferes with work or school AND [2] no improvement using cough treatment per Care Advice  Answer Assessment - Initial Assessment Questions Patient reports symptoms of sinus/chest congestion for 2 weeks- she did take antibiotic she had on hand- Amoxicillin for 5 days which did seem to help but did not clear all symptoms. She now has productive cough with yellow sputum and chest soreness from the cough.  1. ONSET: When did the cough begin?      2 weeks 2. SEVERITY: How bad is the cough today?      Cough causing chest soreness 3. SPUTUM: Describe the color of your sputum (e.g., none, dry cough; clear, white, yellow, green)     yellow 4. HEMOPTYSIS: Are you coughing up any blood? If Yes, ask: How much? (e.g., flecks, streaks, tablespoons, etc.)     no 5. DIFFICULTY BREATHING: Are you having difficulty breathing? If Yes, ask: How bad is it? (e.g., mild, moderate, severe)      Only with cough at times 6. FEVER: Do you have a fever? If Yes, ask: What is your temperature, how was it measured, and when did it start?     Not checked 7. CARDIAC HISTORY: Do you have any history of heart disease? (e.g., heart attack, congestive heart failure)      no 8. LUNG HISTORY: Do  you have any history of lung disease?  (e.g., pulmonary embolus, asthma, emphysema)     no 9. PE RISK FACTORS: Do you have a history of blood clots? (or: recent major surgery, recent prolonged travel, bedridden)     no 10. OTHER SYMPTOMS: Do you have any other symptoms? (e.g., runny nose, wheezing, chest pain)       Congestion, sore throat  12. TRAVEL: Have you traveled out of the country in the last month? (e.g., travel history, exposures)       COVID test negative  Protocols used: Cough - Acute Productive-A-AH

## 2024-07-21 NOTE — Progress Notes (Signed)
 Acute Office Visit  Subjective:     Patient ID: Lindsey Bowman, female    DOB: [birth year], 48 y.o.   MRN: [id number]  Chief Complaint  Patient presents with   Cough    Productive with yellow-clear sputum and chest pain x1 week, states she had the flu last week, used left over antibiotics that cleared things up   Fatigue   head congestion    Cough   Patient is in today for nasal congestion, coughing, chills, fatigue, body aches. The first week the symptoms were ok but the second week she feels her symptoms have worsened. States she figured it was the flu and she took 5 days of amoxicillin left over from a previous infection. States that her symptoms did improve with the amoxicillin, states the sinus pain got better. Now she is having more chest symptoms,  is having some pain in her chest.   Review of Systems  Respiratory:  Positive for cough.   All other systems reviewed and are negative.       Objective:    BP 118/80   Pulse 90   Temp 98.1 F (36.7 C) (Oral)   Ht 5' 8 (1.727 m)   Wt (!) 310 lb 6.4 oz (140.8 kg)   LMP 07/11/2024 (Exact Date)   SpO2 97%   BMI 47.20 kg/m    Physical Exam Vitals reviewed.  Constitutional:      Appearance: Normal appearance. She is well-groomed. She is morbidly obese.  HENT:     Head: Normocephalic and atraumatic.     Right Ear: Tympanic membrane is erythematous and bulging.     Left Ear: Tympanic membrane normal.     Mouth/Throat:     Mouth: Mucous membranes are moist.     Pharynx: Oropharynx is clear. No oropharyngeal exudate or posterior oropharyngeal erythema.  Eyes:     Extraocular Movements: Extraocular movements intact.     Conjunctiva/sclera: Conjunctivae normal.     Pupils: Pupils are equal, round, and reactive to light.  Cardiovascular:     Rate and Rhythm: Normal rate and regular rhythm.     Pulses: Normal pulses.     Heart sounds: S1 normal and S2 normal.  Pulmonary:     Effort: Pulmonary effort is normal.      Breath sounds: Normal breath sounds and air entry.  Abdominal:     General: Abdomen is flat. Bowel sounds are normal.     Palpations: Abdomen is soft.  Musculoskeletal:        General: Normal range of motion.     Cervical back: Normal range of motion and neck supple.     Right lower leg: No edema.     Left lower leg: No edema.  Skin:    General: Skin is warm and dry.  Neurological:     Mental Status: She is alert and oriented to person, place, and time. Mental status is at baseline.     Gait: Gait is intact.  Psychiatric:        Mood and Affect: Mood and affect normal.        Speech: Speech normal.        Behavior: Behavior normal.        Judgment: Judgment normal.     No results found for any visits on 07/21/24.      Assessment & Plan:   Problem List Items Addressed This Visit   None Visit Diagnoses       Non-recurrent acute suppurative  otitis media of right ear without spontaneous rupture of tympanic membrane    -  Primary   Relevant Medications   amoxicillin-clavulanate (AUGMENTIN) 875-125 MG tablet     Acute non-recurrent ethmoidal sinusitis       Relevant Medications   amoxicillin-clavulanate (AUGMENTIN) 875-125 MG tablet     Ear infection on the right is developing, plus she has had continuous unrelenting symptoms without any improvement. Will treat with augment for 7 days.  Meds ordered this encounter  Medications   amoxicillin-clavulanate (AUGMENTIN) 875-125 MG tablet    Sig: Take 1 tablet by mouth 2 (two) times daily for 7 days.    Dispense:  14 tablet    Refill:  0    No follow-ups on file.  Heron CHRISTELLA Sharper, MD

## 2024-07-25 ENCOUNTER — Encounter: Payer: Self-pay | Admitting: Family Medicine

## 2024-07-25 ENCOUNTER — Ambulatory Visit (INDEPENDENT_AMBULATORY_CARE_PROVIDER_SITE_OTHER): Admitting: Family Medicine

## 2024-07-25 VITALS — BP 104/74 | HR 110 | Temp 99.0°F | Ht 68.0 in | Wt 306.0 lb

## 2024-07-25 DIAGNOSIS — H66001 Acute suppurative otitis media without spontaneous rupture of ear drum, right ear: Secondary | ICD-10-CM | POA: Diagnosis not present

## 2024-07-25 NOTE — Progress Notes (Signed)
 Established Patient Office Visit  Subjective   Patient ID: Lindsey Bowman, female    DOB: 23-Feb-1976  Age: 47 y.o. MRN: [id number]  Chief Complaint  Patient presents with   Sinus Problem    Patient complains of recurrent burning sensation in the nose and wants to make sure the medications are working   Diarrhea    Loose stools noted    Sinus Problem Pertinent negatives include no chills.  Diarrhea  Pertinent negatives include no chills or fever.   Discussed the use of AI scribe software for clinical note transcription with the patient, who gave verbal consent to proceed.  History of Present Illness   Lindsey Bowman is a 48 year old female who presents with persistent sinus and ear infection symptoms.  She was seen a few days ago for a sinus infection and has since developed an ear infection. There is no significant improvement in her symptoms despite being on antibiotics for four days. She has taken the same antibiotics previously. She experiences a burning sensation in her sinuses. No frequent nose blowing.       Current Outpatient Medications  Medication Instructions   amoxicillin-clavulanate (AUGMENTIN) 875-125 MG tablet 1 tablet, Oral, 2 times daily   ondansetron  (ZOFRAN -ODT) 4 mg, Oral, Every 8 hours PRN   phentermine  (ADIPEX-P ) 18.75 mg, Oral, Daily before breakfast   topiramate  (TOPAMAX ) 25 MG tablet Take one to two tablets in the evening for weight management    Patient Active Problem List   Diagnosis Date Noted   Wellness examination 06/07/2024   Depression 02/01/2024   Abnormal food appetite 05/12/2023   Stress at home 05/12/2023   Vitamin D  deficiency 11/18/2022   Depression screen 11/04/2022   Prediabetes 11/04/2022   Elevated blood pressure reading without diagnosis of hypertension 10/13/2022   OSA (obstructive sleep apnea) 07/22/2021   NECK PAIN 07/30/2008   DISTURBANCE OF SKIN SENSATION 07/30/2008   Acute upper respiratory infection 06/18/2008    Obesity, unspecified 09/02/2007   ANEMIA-NOS 09/02/2007   MENORRHAGIA 09/02/2007     Review of Systems  Constitutional:  Negative for chills and fever.  Gastrointestinal:  Positive for diarrhea.  All other systems reviewed and are negative.     Objective:     BP 104/74   Pulse (!) 110   Temp 99 F (37.2 C) (Oral)   Ht 5' 8 (1.727 m)   Wt (!) 306 lb (138.8 kg)   LMP 07/11/2024 (Exact Date)   SpO2 97%   BMI 46.53 kg/m    Physical Exam Constitutional:      Appearance: Normal appearance. She is morbidly obese.  HENT:     Nose: Rhinorrhea present.  Eyes:     Conjunctiva/sclera: Conjunctivae normal.  Pulmonary:     Effort: Pulmonary effort is normal.  Neurological:     Mental Status: She is alert and oriented to person, place, and time. Mental status is at baseline.     Physical Exam   HEENT: Right nasal turbinate swollen, right nostril smaller than left. Ear less red, resolving.      No results found for any visits on 07/25/24.    The 10-year ASCVD risk score (Arnett DK, et al., 2019) is: 0.5%    Assessment & Plan:  Non-recurrent acute suppurative otitis media of right ear without spontaneous rupture of tympanic membrane  Assessment and Plan    Acute suppurative otitis media, right ear The right ear infection is improving with reduced redness and resolution of symptoms,  indicating effective antibiotic treatment. - Continue current antibiotic regimen.  Acute sinusitis Persistent sinusitis with burning sensation and inflammation of the mucosal layer. Right nostril turbinate is swollen. Antibiotics have reduced redness but not fully resolved symptoms, suggesting possible non-bacterial causes such as allergies or viral infection. - Continue current antibiotic regimen. - Use nasal saline spray to rinse nasal passages. - Consider using Flonase if it does not exacerbate burning sensation. - Re-evaluate symptoms a few days after completing antibiotics. - Will  consider CT scan of sinuses if symptoms persist.         No follow-ups on file.    Lindsey CHRISTELLA Sharper, MD

## 2024-08-14 ENCOUNTER — Encounter (INDEPENDENT_AMBULATORY_CARE_PROVIDER_SITE_OTHER): Payer: Self-pay | Admitting: Internal Medicine

## 2024-08-14 ENCOUNTER — Ambulatory Visit (INDEPENDENT_AMBULATORY_CARE_PROVIDER_SITE_OTHER): Admitting: Internal Medicine

## 2024-08-14 DIAGNOSIS — G4733 Obstructive sleep apnea (adult) (pediatric): Secondary | ICD-10-CM

## 2024-08-14 DIAGNOSIS — Z6841 Body Mass Index (BMI) 40.0 and over, adult: Secondary | ICD-10-CM

## 2024-08-14 DIAGNOSIS — E66813 Obesity, class 3: Secondary | ICD-10-CM | POA: Diagnosis not present

## 2024-08-14 DIAGNOSIS — R7303 Prediabetes: Secondary | ICD-10-CM

## 2024-08-14 MED ORDER — TOPIRAMATE 25 MG PO TABS: 50.0000 mg | ORAL_TABLET | Freq: Every day | ORAL | 0 refills | Status: DC

## 2024-08-14 MED ORDER — PHENTERMINE HCL 37.5 MG PO TABS: 37.5000 mg | ORAL_TABLET | Freq: Every day | ORAL | 0 refills | Status: DC

## 2024-08-14 NOTE — Assessment & Plan Note (Signed)
 Her hemoglobin A1c and insulin  levels had improved significantly but she is no longer on Wegovy  for pharmacoprevention.  She is now losing weight on phentermine  and topiramate .  Her physical activity levels have gone down.  We discussed again the role and benefits of regular physical activity and strategies to overcome barriers.  We will check hemoglobin A1c at the next visit and if it has gone up after discontinuation of GLP-1 she will be started on metformin  for pharmacoprophylaxis.

## 2024-08-14 NOTE — Assessment & Plan Note (Signed)
 Mild.  She has lost 17% of total body weight with current weight management strategy.

## 2024-08-14 NOTE — Assessment & Plan Note (Addendum)
 Weight: decrease of 64 lb (17.5%) over 1 year, 8 months  Start: 12/10/2022 366 lb (166 kg) (H)  End: 08/14/2024 302 lb (137 kg) (H) Class 3 obesity with recent weight loss of 64 pounds over 20 months. Current weight management includes phentermine  and topiramate . Reports increased appetite and occasional consumption of unhealthy foods, particularly when out. No significant side effects from current medications. Blood pressure and heart rate are well-controlled. Discussed potential addition of metformin , but she declined due to previous adverse effects. Emphasized importance of balanced diet and mindful eating. Discussed affordability and long-term use of GLP-1 medications like Wegovy . - Increased phentermine  to full tablet daily. - Continued topiramate  at two tablets daily. - Provided 90-day supply of medications. - Advised to avoid energy drinks and excessive coffee with phentermine . - Instructed to avoid abrupt discontinuation of topiramate  and to limit alcohol intake. - Scheduled follow-up in one month to monitor blood pressure and heart rate.

## 2024-08-14 NOTE — Progress Notes (Signed)
 Office: [phone]  /  Fax: [phone]  Weight Summary and Body Composition Analysis (BIA)  Vitals Temp: 97.7 F (36.5 C) BP: 120/78 Pulse Rate: 94 SpO2: 97 %   Anthropometric Measurements Height: 5' 8 (1.727 m) Weight: (!) 302 lb (137 kg) BMI (Calculated): 45.93 Weight at Last Visit: 303 lb Weight Lost Since Last Visit: 1 lb Weight Gained Since Last Visit: 0 lb Starting Weight: 360 lb Total Weight Loss (lbs): 58 lb (26.3 kg) Peak Weight: 361 lb   Body Composition  Body Fat %: 51.7 % Fat Mass (lbs): 156.2 lbs Muscle Mass (lbs): 138.4 lbs Total Body Water (lbs): 98.6 lbs Visceral Fat Rating : 17    RMR: 2102  Today's Visit #: 21  Starting Date: 11/04/22   Subjective   Chief Complaint: Obesity  Interval History Discussed the use of AI scribe software for clinical note transcription with the patient, who gave verbal consent to proceed.  History of Present Illness Lindsey Bowman is a 48 year old female who presents for medical weight management.  Since last office visit she has lost 1 pound.  She has been on a weight management journey for over a year and eight months, during which she has lost a total of 64 pounds.  She is currently on phentermine  18.75 mg and taking two tablets of topiramate  in the evening. She experiences increased sweating but no other significant side effects from the medications. The current medication does not suppress her appetite as effectively as previous medications like Qsymia  and Wegovy , which made unhealthy foods unappealing to her.  She describes occasional challenges with food choices, particularly when she is out and about, leading to consumption of foods she feels she should avoid. She did not have cravings for sweets with previous medications, but now she sometimes desires them. Her past experience with metformin  was negative due to significant drops in blood sugar levels, which she does not wish to repeat.  She is  mindful of her diet and tries to avoid keeping tempting foods at home, focusing instead on healthier options like fruits, vegetables, and homemade soups. She engages in physical activity two to three times a week, which she finds manageable and beneficial. She has also started making bread at home, which she enjoys in moderation.     Challenges affecting patient progress: strong hunger signals and/or impaired satiety / inhibitory control and having difficulties with GLP-1 or AOM coverage.    Pharmacotherapy for weight management: She is currently taking Topiramate  (off label use, single agent) with adequate clinical response  and without side effects. and Phentermine  (longterm use, off-label, single agent)  with adequate clinical response  and without side effects..   Assessment and Plan   Treatment Plan For Obesity:  Recommended Dietary Goals  Lindsey Bowman is currently in the action stage of change. As such, her goal is to continue weight management plan. She has agreed to: incorporate prepackaged healthy meals for convenience, incorporate 1-2 meal replacements a day for convenience , and continue current plan  Behavioral Health and Counseling  We discussed the following behavioral modification strategies today: increasing lean protein intake to established goals, decreasing simple carbohydrates , increasing vegetables, increasing fiber rich foods, avoiding skipping meals, increasing water intake , work on meal planning and preparation, work on tracking and journaling calories using tracking application, keeping healthy foods at home, decreasing eating out or consumption of processed foods, and making healthy choices when eating convenient foods, and avoiding temptations and identifying enticing environmental cues.  Additional education and resources provided today: None  Recommended Physical Activity Goals  Lindsey Bowman has been advised to work up to 150 minutes of moderate intensity aerobic activity  a week and strengthening exercises 2-3 times per week for cardiovascular health, weight loss maintenance and preservation of muscle mass.  She has agreed to :  Increase physical activity in their day and reduce sedentary time (increase NEAT)., Increase volume of physical activity to a goal of 240 minutes a week, and Combine aerobic and strengthening exercises for efficiency and improved cardiometabolic health.  Medical Interventions and Pharmacotherapy  We discussed various medication options to help Lindsey Bowman with her weight loss efforts and we both agreed to : Increase phentermine  to 37.5 mg and continue topiramate  50 mg in the evening. and Patient was counseled on the importance of maintaining healthy lifestyle habits, including balanced nutrition, regular physical activity, and behavioral modifications, while taking antiobesity medication.  Patient verbalized understanding that medication is an adjunct to, not a replacement for, lifestyle changes and that the long-term success and weight maintenance depend on continued adherence to these strategies.  We have discussed that long-term use of phentermine  for weight management. Patient informed that use beyond 12 weeks is off-label. Reviewed evidence supporting extended use in select patients with regular monitoring. Risks discussed: insomnia, increased heart rate, elevated BP, and potential for dependence. Alternatives include lifestyle interventions, FDA-approved medications (e.g., Wegovy , Zepbound, Qsymia , Contrave)- these are either not covered, cost prohibitive. Medical history reviewed for contraindications.  Patient verbalized understanding and agreed to continue phentermine  with close monitoring. Plan includes regular follow-up every 4-6 weeks to assess efficacy, side effects, and vitals.  Medication safety: Reviewed common side effects of phentermine , no side effects reported.  Reviewed vitals signs and they are stable Reviewed for  contraindications, none present Reviewed state registry for controlled substances and no other controlled substances found Medication will be discontinued if less than 5% weight loss in 6 months Discussed safety data and off label use for long-term treatment of obesity.  Associated Conditions Impacted by Obesity Treatment  Assessment & Plan Class 3 severe obesity with serious comorbidity and body mass index (BMI) of 45.0 to 49.9 in adult, unspecified obesity type (HCC) Weight: decrease of 64 lb (17.5%) over 1 year, 8 months  Start: 12/10/2022 366 lb (166 kg) (H)  End: 08/14/2024 302 lb (137 kg) (H) Class 3 obesity with recent weight loss of 64 pounds over 20 months. Current weight management includes phentermine  and topiramate . Reports increased appetite and occasional consumption of unhealthy foods, particularly when out. No significant side effects from current medications. Blood pressure and heart rate are well-controlled. Discussed potential addition of metformin , but she declined due to previous adverse effects. Emphasized importance of balanced diet and mindful eating. Discussed affordability and long-term use of GLP-1 medications like Wegovy . - Increased phentermine  to full tablet daily. - Continued topiramate  at two tablets daily. - Provided 90-day supply of medications. - Advised to avoid energy drinks and excessive coffee with phentermine . - Instructed to avoid abrupt discontinuation of topiramate  and to limit alcohol intake. - Scheduled follow-up in one month to monitor blood pressure and heart rate. Prediabetes Her hemoglobin A1c and insulin  levels had improved significantly but she is no longer on Wegovy  for pharmacoprevention.  She is now losing weight on phentermine  and topiramate .  Her physical activity levels have gone down.  We discussed again the role and benefits of regular physical activity and strategies to overcome barriers.  We will check hemoglobin A1c at the  next visit and  if it has gone up after discontinuation of GLP-1 she will be started on metformin  for pharmacoprophylaxis. OSA (obstructive sleep apnea) Mild.  She has lost 17% of total body weight with current weight management strategy.          Objective   Physical Exam:  Blood pressure 120/78, pulse 94, temperature 97.7 F (36.5 C), height 5' 8 (1.727 m), weight (!) 302 lb (137 kg), last menstrual period 07/11/2024, SpO2 97%. Body mass index is 45.92 kg/m.  General: She is overweight, cooperative, alert, well developed, and in no acute distress. PSYCH: Has normal mood, affect and thought process.   HEENT: EOMI, sclerae are anicteric. Lungs: Normal breathing effort, no conversational dyspnea. Extremities: No edema.  Neurologic: No gross sensory or motor deficits. No tremors or fasciculations noted.    Diagnostic Data Reviewed:  BMET    Component Value Date/Time   NA 139 03/02/2024 1052   K 4.4 03/02/2024 1052   CL 102 03/02/2024 1052   CO2 22 03/02/2024 1052   GLUCOSE 92 03/02/2024 1052   GLUCOSE 91 05/11/2009 0257   BUN 6 03/02/2024 1052   CREATININE 0.81 03/02/2024 1052   CALCIUM 9.4 03/02/2024 1052   GFRNONAA >60 05/11/2009 0257   GFRAA  05/11/2009 0257    >60        The eGFR has been calculated using the MDRD equation. This calculation has not been validated in all clinical situations. eGFR's persistently <60 mL/min signify possible Chronic Kidney Disease.   Lab Results  Component Value Date   HGBA1C 5.6 03/02/2024   HGBA1C 6.4 (H) 11/04/2022   Lab Results  Component Value Date   INSULIN  35.8 (H) 10/12/2023   INSULIN  57.0 (H) 11/04/2022   Lab Results  Component Value Date   TSH 1.740 03/02/2024   CBC    Component Value Date/Time   WBC 6.0 03/02/2024 1052   WBC 9.2 05/12/2009 0620   RBC 4.49 03/02/2024 1052   RBC 3.35 (L) 05/12/2009 0620   HGB 13.2 03/02/2024 1052   HCT 42.4 03/02/2024 1052   PLT 423 03/02/2024 1052   MCV 94 03/02/2024 1052   MCH  29.4 03/02/2024 1052   MCHC 31.1 (L) 03/02/2024 1052   MCHC 34.3 05/12/2009 0620   RDW 12.2 03/02/2024 1052   Iron Studies No results found for: IRON, TIBC, FERRITIN, IRONPCTSAT Lipid Panel     Component Value Date/Time   CHOL 152 03/02/2024 1052   TRIG 63 03/02/2024 1052   HDL 58 03/02/2024 1052   CHOLHDL 2.6 03/02/2024 1052   CHOLHDL 2.5 Ratio 09/02/2007 2043   VLDL 20 09/02/2007 2043   LDLCALC 81 03/02/2024 1052   Hepatic Function Panel     Component Value Date/Time   PROT 7.1 03/02/2024 1052   ALBUMIN 4.1 03/02/2024 1052   AST 20 03/02/2024 1052   ALT 18 03/02/2024 1052   ALKPHOS 51 03/02/2024 1052   BILITOT 0.3 03/02/2024 1052   BILIDIR <0.1 mg/dL 87/73/7991 7956   IBILI NOT CALC mg/dL 87/73/7991 7956      Component Value Date/Time   TSH 1.740 03/02/2024 1052   TSH 1.760 11/04/2022 0829   Nutritional Lab Results  Component Value Date   VD25OH 27.3 (L) 03/02/2024   VD25OH 30.5 03/04/2023   VD25OH 22.5 (L) 11/04/2022    Medications: Outpatient Encounter Medications as of 08/14/2024  Medication Sig   topiramate  (TOPAMAX ) 25 MG tablet Take 2 tablets (50 mg total) by mouth daily.   [DISCONTINUED]  ondansetron  (ZOFRAN -ODT) 4 MG disintegrating tablet Take 1 tablet (4 mg total) by mouth every 8 (eight) hours as needed.   [DISCONTINUED] phentermine  (ADIPEX-P ) 37.5 MG tablet Take 0.5 tablets (18.75 mg total) by mouth daily before breakfast.   [DISCONTINUED] topiramate  (TOPAMAX ) 25 MG tablet Take one to two tablets in the evening for weight management   phentermine  (ADIPEX-P ) 37.5 MG tablet Take 1 tablet (37.5 mg total) by mouth daily before breakfast.   No facility-administered encounter medications on file as of 08/14/2024.     Follow-Up   Return in about 4 weeks (around 09/11/2024) for For Weight Mangement with Dr. Francyne.SABRA She was informed of the importance of frequent follow up visits to maximize her success with intensive lifestyle modifications for her  multiple health conditions.  Attestation Statement   Reviewed by clinician on day of visit: allergies, medications, problem list, medical history, surgical history, family history, social history, and previous encounter notes.     Lucas Francyne, MD

## 2024-09-26 ENCOUNTER — Encounter (HOSPITAL_BASED_OUTPATIENT_CLINIC_OR_DEPARTMENT_OTHER): Admitting: Family Medicine

## 2024-10-12 ENCOUNTER — Encounter (INDEPENDENT_AMBULATORY_CARE_PROVIDER_SITE_OTHER): Payer: Self-pay | Admitting: Internal Medicine

## 2024-10-12 ENCOUNTER — Ambulatory Visit (INDEPENDENT_AMBULATORY_CARE_PROVIDER_SITE_OTHER): Admitting: Internal Medicine

## 2024-10-12 ENCOUNTER — Telehealth (INDEPENDENT_AMBULATORY_CARE_PROVIDER_SITE_OTHER): Payer: Self-pay | Admitting: *Deleted

## 2024-10-12 VITALS — BP 106/69 | HR 98 | Temp 98.7°F | Ht 68.0 in | Wt 308.0 lb

## 2024-10-12 DIAGNOSIS — G4733 Obstructive sleep apnea (adult) (pediatric): Secondary | ICD-10-CM | POA: Diagnosis not present

## 2024-10-12 DIAGNOSIS — Z6841 Body Mass Index (BMI) 40.0 and over, adult: Secondary | ICD-10-CM

## 2024-10-12 DIAGNOSIS — E66813 Obesity, class 3: Secondary | ICD-10-CM | POA: Diagnosis not present

## 2024-10-12 DIAGNOSIS — R7303 Prediabetes: Secondary | ICD-10-CM | POA: Diagnosis not present

## 2024-10-12 MED ORDER — WEGOVY 0.25 MG/0.5ML ~~LOC~~ SOAJ: 0.2500 mg | SUBCUTANEOUS | 0 refills | Status: AC

## 2024-10-12 NOTE — Assessment & Plan Note (Signed)
" ° °  Class 3 severe obesity with a BMI of 45.0-49.9. Significant weight loss of 58 pounds over 13 months with medically supervised weight management, including phentermine  and topiramate . Recent weight gain of 6 pounds over two months due to lifestyle changes, including increased consumption of processed foods  and decreased physical activity due to weather. Current medications include phentermine  and topiramate , with plans to transition to Wegovy . Emphasis on lifestyle modifications, including diet and exercise, to support weight management. Discussion of the importance of consistency and alignment between goals and actions. Consideration of environmental factors and the role of family in supporting weight management efforts. - Discontinue phentermine  and topiramate  - Initiated Wegovy  0.25 mg once a week - Provided with a new meal plan, low-carb targeting 1500 cal/day moderate in protein - Resume previous levels of physical activity recent decrease due to weather.  "

## 2024-10-12 NOTE — Assessment & Plan Note (Signed)
 Stable on current therapy. Weight reduction is anticipated to improve OSA severity. Continue current management; reassess symptoms as weight loss progresses.

## 2024-10-12 NOTE — Telephone Encounter (Signed)
 Marcelene Dove (Key: [id number])  PA RESUBMITTED [employer] is reviewing your PA request. Typically an electronic response will be received within 24-72 hours. To check for an update later, open this request from your dashboard.  You may close this dialog and return to your dashboard to perform other tasks.

## 2024-10-12 NOTE — Assessment & Plan Note (Signed)
 Emphasis on dietary modifications to manage blood glucose levels. Discussion of the role of carbohydrates in weight gain and insulin  resistance. Encouragement to focus on low-carb, high-protein meals to manage blood glucose levels and support weight loss. - Continue to monitor carbohydrate intake and focus on low-carb, high-protein meals. - Encouraged regular physical activity to improve insulin  sensitivity. - Discussed the importance of maintaining a calorie deficit for weight loss and blood glucose control.
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
## 2024-10-12 NOTE — Telephone Encounter (Signed)
 Lindsey Bowman (Key: [facility])  Your information has been sent to [employer].

## 2024-11-08 ENCOUNTER — Ambulatory Visit (INDEPENDENT_AMBULATORY_CARE_PROVIDER_SITE_OTHER): Admitting: Internal Medicine

## 2025-06-12 ENCOUNTER — Encounter (HOSPITAL_BASED_OUTPATIENT_CLINIC_OR_DEPARTMENT_OTHER): Admitting: Family Medicine
